# Patient Record
Sex: Male | Born: 1990 | Race: Black or African American | Hispanic: No | Marital: Single | Smoking: Never smoker
Health system: Southern US, Community
[De-identification: ages and names within clinical notes are randomized; demographics above are authoritative.]

## PROBLEM LIST (undated history)

## (undated) DIAGNOSIS — I82409 Acute embolism and thrombosis of unspecified deep veins of unspecified lower extremity: Secondary | ICD-10-CM

## (undated) DIAGNOSIS — F3181 Bipolar II disorder: Secondary | ICD-10-CM

## (undated) DIAGNOSIS — D6859 Other primary thrombophilia: Secondary | ICD-10-CM

---

## 2012-04-19 ENCOUNTER — Other Ambulatory Visit: Payer: Self-pay | Admitting: Pediatrics

## 2012-04-19 LAB — IRON: Iron: 211 ug/dL — ABNORMAL HIGH

## 2012-04-19 LAB — PROTIME-INR
INR: 1.5
Prothrombin Time: 18.8 secs — ABNORMAL HIGH (ref 11.5–14.7)

## 2012-04-30 ENCOUNTER — Other Ambulatory Visit: Payer: Self-pay | Admitting: Pediatrics

## 2012-05-03 DIAGNOSIS — Z7901 Long term (current) use of anticoagulants: Secondary | ICD-10-CM | POA: Insufficient documentation

## 2018-02-19 DIAGNOSIS — F329 Major depressive disorder, single episode, unspecified: Secondary | ICD-10-CM | POA: Diagnosis present

## 2018-02-19 DIAGNOSIS — F32A Depression, unspecified: Secondary | ICD-10-CM | POA: Diagnosis present

## 2019-09-27 ENCOUNTER — Other Ambulatory Visit: Payer: Self-pay

## 2019-09-27 ENCOUNTER — Emergency Department: Payer: Self-pay

## 2019-09-27 ENCOUNTER — Encounter: Payer: Self-pay | Admitting: Emergency Medicine

## 2019-09-27 ENCOUNTER — Emergency Department
Admission: EM | Admit: 2019-09-27 | Discharge: 2019-09-27 | Disposition: A | Discharge status: Home / Self Care | Payer: Self-pay | Attending: Student in an Organized Health Care Education/Training Program | Admitting: Student in an Organized Health Care Education/Training Program

## 2019-09-27 DIAGNOSIS — R0789 Other chest pain: Secondary | ICD-10-CM | POA: Insufficient documentation

## 2019-09-27 DIAGNOSIS — Z7901 Long term (current) use of anticoagulants: Secondary | ICD-10-CM | POA: Insufficient documentation

## 2019-09-27 DIAGNOSIS — R079 Chest pain, unspecified: Secondary | ICD-10-CM

## 2019-09-27 DIAGNOSIS — R791 Abnormal coagulation profile: Secondary | ICD-10-CM | POA: Insufficient documentation

## 2019-09-27 HISTORY — DX: Other primary thrombophilia: D68.59

## 2019-09-27 HISTORY — DX: Acute embolism and thrombosis of unspecified deep veins of unspecified lower extremity: I82.409

## 2019-09-27 LAB — BASIC METABOLIC PANEL
Anion gap: 10 (ref 5–15)
BUN: 20 mg/dL (ref 6–20)
CO2: 27 mmol/L (ref 22–32)
Calcium: 9.1 mg/dL (ref 8.9–10.3)
Chloride: 102 mmol/L (ref 98–111)
Creatinine, Ser: 1.11 mg/dL (ref 0.61–1.24)
GFR calc Af Amer: >60 mL/min (ref >60)
GFR calc non Af Amer: >60 mL/min (ref >60)
Glucose, Bld: 77 mg/dL (ref 70–99)
Potassium: 4 mmol/L (ref 3.5–5.1)
Sodium: 139 mmol/L (ref 135–145)

## 2019-09-27 LAB — CBC
HCT: 45.9 % (ref 39.0–52.0)
Hemoglobin: 15.6 g/dL (ref 13.0–17.0)
MCH: 30.8 pg (ref 26.0–34.0)
MCHC: 34 g/dL (ref 30.0–36.0)
MCV: 90.5 fL (ref 80.0–100.0)
Platelets: 218 10*3/uL (ref 150–400)
RBC: 5.07 MIL/uL (ref 4.22–5.81)
RDW: 12.9 % (ref 11.5–15.5)
WBC: 5.3 10*3/uL (ref 4.0–10.5)
nRBC: 0 % (ref 0.0–0.2)

## 2019-09-27 LAB — PROTIME-INR
INR: 1.1 (ref 0.8–1.2)
Prothrombin Time: 14.5 seconds (ref 11.4–15.2)

## 2019-09-27 LAB — TROPONIN I (HIGH SENSITIVITY)
Troponin I (High Sensitivity): 3 ng/L (ref <18)
Troponin I (High Sensitivity): 3 ng/L (ref <18)

## 2019-09-27 LAB — BRAIN NATRIURETIC PEPTIDE: B Natriuretic Peptide: 12 pg/mL (ref 0.0–100.0)

## 2019-09-27 MED ORDER — ENOXAPARIN SODIUM 80 MG/0.8ML ~~LOC~~ SOLN: 1.0000 mg/kg | Freq: Two times a day (BID) | SUBCUTANEOUS | 0 refills | Status: DC

## 2019-09-27 MED ORDER — ENOXAPARIN SODIUM 80 MG/0.8ML ~~LOC~~ SOLN
1.0000 mg/kg | Freq: Once | SUBCUTANEOUS | Status: AC
  Administered 2019-09-27: 75 mg via SUBCUTANEOUS
  Filled 2019-09-27 (×2): qty 0.8

## 2019-09-27 MED ORDER — TRAMADOL HCL 50 MG PO TABS: 50.0000 mg | ORAL_TABLET | Freq: Four times a day (QID) | ORAL | 0 refills | Status: DC | PRN

## 2019-09-27 NOTE — ED Notes (Signed)
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  Reports taking Coumadin as prescribed but has not had INR checked recently.

## 2019-09-27 NOTE — ED Provider Notes (Signed)
[facility] Emergency Department Provider Note    First MD Initiated Contact with Patient 09/27/19 1329     (approximate)  I have reviewed the triage vital signs and the nursing notes.   HISTORY  Chief Complaint Chest Pain    HPI Ledarrius L Morgan is a 28 y.o. male presents the ER for evaluation of right-sided chest pain and some shortness of breath developed over the past several days.  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  He is on Coumadin taking 10 mg daily for the past several months without issues.  He has not followed up in Coumadin clinic in over 1 month.  States he does eat lots of leafy green vegetables.  Denies any fatigue.  Pain is fairly mild.  Otherwise feels well.  No nausea or vomiting.  States he just wanted to be evaluated given his history.    Past Medical History:  Diagnosis Date  . DVT (deep venous thrombosis) (HCC)   . Protein C deficiency (HCC)   . Protein S deficiency (HCC)    No family history on file. History reviewed. No pertinent surgical history. There are no active problems to display for this patient.     Prior to Admission medications   Medication Sig Start Date End Date Taking? Authorizing Provider  enoxaparin (LOVENOX) 80 MG/0.8ML injection Inject 0.75 mLs (75 mg total) into the skin every 12 (twelve) hours for 5 days. 09/27/19 10/02/19  Willy Eddy, MD  traMADol (ULTRAM) 50 MG tablet Take 1 tablet (50 mg total) by mouth every 6 (six) hours as needed. 09/27/19 09/26/20  Willy Eddy, MD    Allergies Demerol [meperidine hcl], Dilaudid [hydromorphone hcl], and Percocet [oxycodone-acetaminophen]    Social History Social History   Tobacco Use  . Smoking status: Never Smoker  . Smokeless tobacco: Never Used  Substance Use Topics  . Alcohol use: Yes    Frequency: Never  . Drug use: Never    Review of Systems Patient denies  headaches, rhinorrhea, blurry vision, numbness, shortness of breath, chest pain, edema, cough, abdominal pain, nausea, vomiting, diarrhea, dysuria, fevers, rashes or hallucinations unless otherwise stated above in HPI. ____________________________________________   PHYSICAL EXAM:  VITAL SIGNS: Vitals:   09/27/19 1145 09/27/19 1359  BP: 118/81 123/82  Pulse: 69 68  Resp: 18 16  Temp: 98.2 F (36.8 C)   SpO2: 99% 99%    Constitutional: Alert and oriented.  Eyes: Conjunctivae are normal.  Head: Atraumatic. Nose: No congestion/rhinnorhea. Mouth/Throat: Mucous membranes are moist.   Neck: No stridor. Painless ROM.  Cardiovascular: Normal rate, regular rhythm. Grossly normal heart sounds.  Good peripheral circulation. Respiratory: Normal respiratory effort.  No retractions. Lungs CTAB. Gastrointestinal: Soft and nontender. No distention. No abdominal bruits. No CVA tenderness. Genitourinary:  Musculoskeletal: No lower extremity tenderness nor edema.  No joint effusions. Neurologic:  Normal speech and language. No gross focal neurologic deficits are appreciated. No facial droop Skin:  Skin is warm, dry and intact. No rash noted. Psychiatric: Mood and affect are normal. Speech and behavior are normal.  ____________________________________________   LABS (all labs ordered are listed, but only abnormal results are displayed)  Results for orders placed or performed during the hospital encounter of 09/27/19 (from the past 24 hour(s))  Basic metabolic panel     Status: None   Collection Time: 09/27/19 11:54 AM  Result Value Ref Range   Sodium 139 135 -  145 mmol/L   Potassium 4.0 3.5 - 5.1 mmol/L   Chloride 102 98 - 111 mmol/L   CO2 27 22 - 32 mmol/L   Glucose, Bld 77 70 - 99 mg/dL   BUN 20 6 - 20 mg/dL   Creatinine, Ser 1.11 0.61 - 1.24 mg/dL   Calcium 9.1 8.9 - 10.3 mg/dL   GFR calc non Af Amer >60 >60 mL/min   GFR calc Af Amer >60 >60 mL/min   Anion gap 10 5 - 15  CBC      Status: None   Collection Time: 09/27/19 11:54 AM  Result Value Ref Range   WBC 5.3 4.0 - 10.5 K/uL   RBC 5.07 4.22 - 5.81 MIL/uL   Hemoglobin 15.6 13.0 - 17.0 g/dL   HCT 45.9 39.0 - 52.0 %   MCV 90.5 80.0 - 100.0 fL   MCH 30.8 26.0 - 34.0 pg   MCHC 34.0 30.0 - 36.0 g/dL   RDW 12.9 11.5 - 15.5 %   Platelets 218 150 - 400 K/uL   nRBC 0.0 0.0 - 0.2 %  Troponin I (High Sensitivity)     Status: None   Collection Time: 09/27/19 11:54 AM  Result Value Ref Range   Troponin I (High Sensitivity) 3 <18 ng/L  Protime-INR (order if Patient is taking Coumadin / Warfarin)     Status: None   Collection Time: 09/27/19 11:54 AM  Result Value Ref Range   Prothrombin Time 14.5 11.4 - 15.2 seconds   INR 1.1 0.8 - 1.2  Brain natriuretic peptide     Status: None   Collection Time: 09/27/19 11:54 AM  Result Value Ref Range   B Natriuretic Peptide 12.0 0.0 - 100.0 pg/mL  Troponin I (High Sensitivity)     Status: None   Collection Time: 09/27/19  1:54 PM  Result Value Ref Range   Troponin I (High Sensitivity) 3 <18 ng/L   ____________________________________________  EKG My review and personal interpretation at Time: 11.42   Indication: chest pain  Rate: 60  Rhythm: sinus Axis: normal Other: no stemi or st depression ____________________________________________  RADIOLOGY  I personally reviewed all radiographic images ordered to evaluate for the above acute complaints and reviewed radiology reports and findings.  These findings were personally discussed with the patient.  Please see medical record for radiology report.  ____________________________________________   PROCEDURES  Procedure(s) performed:  Procedures    Critical Care performed: no ____________________________________________   INITIAL IMPRESSION / ASSESSMENT AND PLAN / ED COURSE  Pertinent labs & imaging results that were available during my care of the patient were reviewed by me and considered in my medical decision  making (see chart for details).   DDX: ACS, pericarditis, esophagitis, boerhaaves, pe, dissection, pna, bronchitis, costochondritis   Lance Morgan is a 28 y.o. who presents to the ED with symptoms as described above.  Patient's presentation is somewhat complicated by his history of protein C&S deficiency.  Is on Coumadin however his INR is low.  I have a lower suspicion for ACS.  May be PE but if so patient is minimally symptomatic without any tachycardia or hypoxia.  Will order cardiac enzymes as well as BNP to further restratify and evaluate for any evidence of right heart strain.  Clinical Course as of Sep 26 1499  Wed Sep 27, 2019  1458 Patient's blood work is reassuring.  At this point not showing any signs of right heart strain.  He is supposed to be anticoagulated therefore  do not feel that CT imaging clinically indicated at this time as does not change her management.  Discussed the importance of close outpatient follow-up with Coumadin clinic.  Patient agrees to keep better track of his diet as that may may be impacting his INR.  Will start Lovenox bridge in the interim until he can follow-up with PCP.   [PR]    Clinical Course User Index [PR] Willy Eddyobinson, Jaquila Santelli, MD    The patient was evaluated in Emergency Department today for the symptoms described in the history of present illness. He/she was evaluated in the context of the global COVID-19 pandemic, which necessitated consideration that the patient might be at risk for infection with the SARS-CoV-2 virus that causes COVID-19. Institutional protocols and algorithms that pertain to the evaluation of patients at risk for COVID-19 are in a state of rapid change based on information released by regulatory bodies including the CDC and federal and state organizations. These policies and algorithms were followed during the patient's care in the ED.  As part of my medical decision making, I reviewed the following data within the electronic  MEDICAL RECORD NUMBER Nursing notes reviewed and incorporated, Labs reviewed, notes from prior ED visits and [place] Controlled Substance Database   ____________________________________________   FINAL CLINICAL IMPRESSION(S) / ED DIAGNOSES  Final diagnoses:  Chest pain, unspecified type  Subtherapeutic international normalized ratio (INR)      NEW MEDICATIONS STARTED DURING THIS VISIT:  New Prescriptions   ENOXAPARIN (LOVENOX) 80 MG/0.8ML INJECTION    Inject 0.75 mLs (75 mg total) into the skin every 12 (twelve) hours for 5 days.   TRAMADOL (ULTRAM) 50 MG TABLET    Take 1 tablet (50 mg total) by mouth every 6 (six) hours as needed.     Note:  This document was prepared using Dragon voice recognition software and may include unintentional dictation errors.    Willy Eddyobinson, Arrabella Westerman, MD 09/27/19 1500

## 2019-09-27 NOTE — Discharge Instructions (Signed)
Please call to make an appointment with your PCP for further management of your Coumadin levels.  I have ordered a Lovenox bridge for the next 5 days sent to your pharmacy.

## 2019-09-27 NOTE — ED Triage Notes (Addendum)
Pt in via POV, reports right side chest pain over the last few days, worsening today.  Denies accompanying symptoms, denies any recent injury; NAD noted at this time.

## 2019-10-18 ENCOUNTER — Emergency Department
Admission: EM | Admit: 2019-10-18 | Discharge: 2019-10-18 | Disposition: A | Discharge status: Home / Self Care | Payer: Self-pay | Attending: Emergency Medicine | Admitting: Emergency Medicine

## 2019-10-18 ENCOUNTER — Other Ambulatory Visit: Payer: Self-pay

## 2019-10-18 ENCOUNTER — Emergency Department: Payer: Self-pay

## 2019-10-18 ENCOUNTER — Encounter: Payer: Self-pay | Admitting: Emergency Medicine

## 2019-10-18 DIAGNOSIS — I808 Phlebitis and thrombophlebitis of other sites: Secondary | ICD-10-CM | POA: Insufficient documentation

## 2019-10-18 DIAGNOSIS — M7989 Other specified soft tissue disorders: Secondary | ICD-10-CM

## 2019-10-18 DIAGNOSIS — I809 Phlebitis and thrombophlebitis of unspecified site: Secondary | ICD-10-CM

## 2019-10-18 DIAGNOSIS — Z7901 Long term (current) use of anticoagulants: Secondary | ICD-10-CM | POA: Insufficient documentation

## 2019-10-18 DIAGNOSIS — M79602 Pain in left arm: Secondary | ICD-10-CM

## 2019-10-18 LAB — CBC
HCT: 47.3 % (ref 39.0–52.0)
Hemoglobin: 16.4 g/dL (ref 13.0–17.0)
MCH: 30.5 pg (ref 26.0–34.0)
MCHC: 34.7 g/dL (ref 30.0–36.0)
MCV: 88.1 fL (ref 80.0–100.0)
Platelets: 185 10*3/uL (ref 150–400)
RBC: 5.37 MIL/uL (ref 4.22–5.81)
RDW: 12.8 % (ref 11.5–15.5)
WBC: 4.2 10*3/uL (ref 4.0–10.5)
nRBC: 0 % (ref 0.0–0.2)

## 2019-10-18 LAB — PROTIME-INR
INR: 1.6 — ABNORMAL HIGH (ref 0.8–1.2)
Prothrombin Time: 19.3 seconds — ABNORMAL HIGH (ref 11.4–15.2)

## 2019-10-18 LAB — COMPREHENSIVE METABOLIC PANEL
ALT: 16 U/L (ref 0–44)
AST: 18 U/L (ref 15–41)
Albumin: 4.5 g/dL (ref 3.5–5.0)
Alkaline Phosphatase: 48 U/L (ref 38–126)
Anion gap: 14 (ref 5–15)
BUN: 13 mg/dL (ref 6–20)
CO2: 25 mmol/L (ref 22–32)
Calcium: 8.7 mg/dL — ABNORMAL LOW (ref 8.9–10.3)
Chloride: 98 mmol/L (ref 98–111)
Creatinine, Ser: 1.12 mg/dL (ref 0.61–1.24)
GFR calc Af Amer: >60 mL/min (ref >60)
GFR calc non Af Amer: >60 mL/min (ref >60)
Glucose, Bld: 92 mg/dL (ref 70–99)
Potassium: 4.3 mmol/L (ref 3.5–5.1)
Sodium: 137 mmol/L (ref 135–145)
Total Bilirubin: 0.9 mg/dL (ref 0.3–1.2)
Total Protein: 7.6 g/dL (ref 6.5–8.1)

## 2019-10-18 LAB — APTT: aPTT: 43 seconds — ABNORMAL HIGH (ref 24–36)

## 2019-10-18 NOTE — ED Notes (Signed)
Pt in US

## 2019-10-18 NOTE — Discharge Instructions (Addendum)
Warm compresses and elevation. No DVT found.    1. No evidence of DVT within the left upper extremity. 2. Examination is positive for occlusive superficial thrombophlebitis involving the left basilic vein. Electronically Signed   By: Sandi Mariscal M.D.   On: 10/18/2019 12:49   Per hematology recommends:  10mg  MWFSS days a week.  7.5 2 days a week T/TH INR f/u in one week.

## 2019-10-18 NOTE — ED Triage Notes (Addendum)
Pt sent from [facility] for rule out DVT in LFT forearm. PT hx of DVT, takes coumadin. Swelling noted. C/o pain and swelling to extremity x1wk.   AND ntoed

## 2019-10-18 NOTE — ED Notes (Signed)
Pt reports for the last week and a half his left forearm has had some swelling and pain along the vein. Pt denies injuries. Redness and swelling noted.

## 2019-10-18 NOTE — ED Provider Notes (Signed)
[facility] Emergency Department Provider Note  ____________________________________________   First MD Initiated Contact with Patient 10/18/19 1221     (approximate)  I have reviewed the triage vital signs and the nursing notes.   HISTORY  Chief Complaint Arm Pain    HPI Carlton L Kingry is a 28 y.o. male with prior history of DVT secondary to protein C and S deficiency in the left forearm who is on Coumadin who presents with worsening pain and swelling to his extremity for the past 1 week.  Swelling is constant, moderate, nothing makes better, nothing makes it worse.  To note patient was seen on 10/28 with subtherapeutic INR and was bridged with Lovenox.  He was placed on 5 days of Lovenox but he did not take it.  He says he was not able to afford it.  He says that he instead was just more consistent with taking his warfarin since prior to being seen he had some days where he would actually forget to take it.  He took 10 mg today.  He takes 10 mg Monday Wednesday Friday and 7.5 the other days.  He denies any shortness of breath or chest pain.          Past Medical History:  Diagnosis Date   DVT (deep venous thrombosis) (HCC)    Protein C deficiency (HCC)    Protein S deficiency (HCC)     There are no active problems to display for this patient.   History reviewed. No pertinent surgical history.  Prior to Admission medications   Medication Sig Start Date End Date Taking? Authorizing Provider  enoxaparin (LOVENOX) 80 MG/0.8ML injection Inject 0.75 mLs (75 mg total) into the skin every 12 (twelve) hours for 5 days. 09/27/19 10/02/19  Willy Eddy, MD  traMADol (ULTRAM) 50 MG tablet Take 1 tablet (50 mg total) by mouth every 6 (six) hours as needed. 09/27/19 09/26/20  Willy Eddy, MD    Allergies Demerol [meperidine hcl], Dilaudid [hydromorphone hcl], and Percocet [oxycodone-acetaminophen]  No family history on file.  Social  History Social History   Tobacco Use   Smoking status: Never Smoker   Smokeless tobacco: Never Used  Substance Use Topics   Alcohol use: Yes    Frequency: Never   Drug use: Never      Review of Systems Constitutional: No fever/chills Eyes: No visual changes. ENT: No sore throat. Cardiovascular: Denies chest pain. Respiratory: Denies shortness of breath. Gastrointestinal: No abdominal pain.  No nausea, no vomiting.  No diarrhea.  No constipation. Genitourinary: Negative for dysuria. Musculoskeletal: Negative for back pain. Skin: Negative for rash. Neurological: Negative for headaches, focal weakness or numbness. All other ROS negative ____________________________________________   PHYSICAL EXAM:  VITAL SIGNS: ED Triage Vitals  Enc Vitals Group     BP 10/18/19 1142 118/76     Pulse Rate 10/18/19 1142 78     Resp 10/18/19 1142 16     Temp 10/18/19 1143 99.2 F (37.3 C)     Temp Source 10/18/19 1143 Oral     SpO2 10/18/19 1142 97 %     Weight --      Height --      Head Circumference --      Peak Flow --      Pain Score 10/18/19 1141 7     Pain Loc --      Pain Edu? --      Excl. in GC? --     Constitutional: Alert  and oriented. Well appearing and in no acute distress. Eyes: Conjunctivae are normal. EOMI. Head: Atraumatic. Nose: No congestion/rhinnorhea. Mouth/Throat: Mucous membranes are moist.   Neck: No stridor. Trachea Midline. FROM Cardiovascular: Normal rate, regular rhythm. Grossly normal heart sounds.  Good peripheral circulation. Respiratory: Normal respiratory effort.  No retractions. Lungs CTAB. Gastrointestinal: Soft and nontender. No distention. No abdominal bruits.  Musculoskeletal: Palpable cord on the left arm with some mild erythema, 2+ distal pulse, mild swelling compared to the arm. Neurologic:  Normal speech and language. No gross focal neurologic deficits are appreciated.  Skin:  Skin is warm, dry and intact. No rash  noted. Psychiatric: Mood and affect are normal. Speech and behavior are normal. GU: Deferred   ____________________________________________   LABS (all labs ordered are listed, but only abnormal results are displayed)  Labs Reviewed  COMPREHENSIVE METABOLIC PANEL - Abnormal; Notable for the following components:      Result Value   Calcium 8.7 (*)    All other components within normal limits  APTT - Abnormal; Notable for the following components:   aPTT 43 (*)    All other components within normal limits  PROTIME-INR - Abnormal; Notable for the following components:   Prothrombin Time 19.3 (*)    INR 1.6 (*)    All other components within normal limits  CBC   ____________________________________________  RADIOLOGY   Official radiology report(s): [place] Venous Img Upper Uni Left (dvt)  Result Date: 10/18/2019 CLINICAL DATA:  Left lower extremity pain and edema. History of DVT. Patient is currently on anticoagulation. Evaluate for acute or chronic DVT. EXAM: LEFT UPPER EXTREMITY VENOUS DOPPLER ULTRASOUND TECHNIQUE: Gray-scale sonography with graded compression, as well as color Doppler and duplex ultrasound were performed to evaluate the upper extremity deep venous system from the level of the subclavian vein and including the jugular, axillary, basilic, radial, ulnar and upper cephalic vein. Spectral Doppler was utilized to evaluate flow at rest and with distal augmentation maneuvers. COMPARISON:  None. FINDINGS: Contralateral Subclavian Vein: Respiratory phasicity is normal and symmetric with the symptomatic side. No evidence of thrombus. Normal compressibility. Internal Jugular Vein: No evidence of thrombus. Normal compressibility, respiratory phasicity and response to augmentation. Subclavian Vein: No evidence of thrombus. Normal compressibility, respiratory phasicity and response to augmentation. Axillary Vein: No evidence of thrombus. Normal compressibility, respiratory phasicity and  response to augmentation. Cephalic Vein: No evidence of thrombus. Normal compressibility, respiratory phasicity and response to augmentation. Basilic Vein: There is hypoechoic occlusive thrombus involving the left basilic vein (images 20 through 24). Brachial Veins: No evidence of thrombus. Normal compressibility, respiratory phasicity and response to augmentation. Radial Veins: No evidence of thrombus. Normal compressibility, respiratory phasicity and response to augmentation. Ulnar Veins: No evidence of thrombus. Normal compressibility, respiratory phasicity and response to augmentation. Venous Reflux:  None visualized. Other Findings:  None visualized. IMPRESSION: 1. No evidence of DVT within the left upper extremity. 2. Examination is positive for occlusive superficial thrombophlebitis involving the left basilic vein. Electronically Signed   By: Simonne ComeJohn  Watts M.D.   On: 10/18/2019 12:49    ____________________________________________   PROCEDURES  Procedure(s) performed (including Critical Care):  Procedures   ____________________________________________   INITIAL IMPRESSION / ASSESSMENT AND PLAN / ED COURSE  Pate L Mapps was evaluated in Emergency Department on 10/18/2019 for the symptoms described in the history of present illness. He was evaluated in the context of the global COVID-19 pandemic, which necessitated consideration that the patient might be at risk for infection with the  SARS-CoV-2 virus that causes COVID-19. Institutional protocols and algorithms that pertain to the evaluation of patients at risk for COVID-19 are in a state of rapid change based on information released by regulatory bodies including the CDC and federal and state organizations. These policies and algorithms were followed during the patient's care in the ED.    This is most concerning for superficial thrombophlebitis versus DVT.  No fluctuation to suggest abscess.  If no exam is just cellulitis.  Good distal  pulse so unlikely arterial occlusion.  No shortness of breath or chest pain to suggest PE.  Will evaluate patient's INR to see if he is therapeutic.   INR is 1.6 which is subtherapeutic but higher than it was 3 weeks ago at 1.1.  Ultrasound does not have evidence of DVT but does have occlusive superficial thrombophlebitis in the involving the left basilic vein.   D/w Dr. Mike Gip from hematology given pt has now had multiple ED visits with subtherapeutic INR.  Total 60mg  warfarin daily.  10mg  5 days a week.  7.5 2 days a week. INR f/u in one week. Does not need lovenox bridge at this time giving INR.   I discussed the provisional nature of ED diagnosis, the treatment so far, the ongoing plan of care, follow up appointments and return precautions with the patient and any family or support people present. They expressed understanding and agreed with the plan, discharged home.       ____________________________________________   FINAL CLINICAL IMPRESSION(S) / ED DIAGNOSES   Final diagnoses:  Arm swelling  Thrombophlebitis      MEDICATIONS GIVEN DURING THIS VISIT:  Medications - No data to display   ED Discharge Orders    None       Note:  This document was prepared using Dragon voice recognition software and may include unintentional dictation errors.   Vanessa [place], MD 10/18/19 [phone]

## 2019-12-07 ENCOUNTER — Other Ambulatory Visit: Payer: Self-pay

## 2019-12-08 ENCOUNTER — Ambulatory Visit: Payer: PRIVATE HEALTH INSURANCE | Attending: Internal Medicine

## 2019-12-08 DIAGNOSIS — Z20822 Contact with and (suspected) exposure to covid-19: Secondary | ICD-10-CM

## 2019-12-10 LAB — NOVEL CORONAVIRUS, NAA: SARS-CoV-2, NAA: NOT DETECTED

## 2020-01-15 ENCOUNTER — Ambulatory Visit: Payer: PRIVATE HEALTH INSURANCE | Attending: Internal Medicine

## 2020-07-31 ENCOUNTER — Ambulatory Visit
Admission: EM | Admit: 2020-07-31 | Discharge: 2020-07-31 | Disposition: A | Discharge status: Home / Self Care | Payer: PRIVATE HEALTH INSURANCE | Attending: Emergency Medicine | Admitting: Emergency Medicine

## 2020-07-31 ENCOUNTER — Other Ambulatory Visit: Payer: Self-pay

## 2020-07-31 ENCOUNTER — Encounter: Payer: Self-pay | Admitting: Emergency Medicine

## 2020-07-31 DIAGNOSIS — R609 Edema, unspecified: Secondary | ICD-10-CM | POA: Diagnosis not present

## 2020-07-31 HISTORY — DX: Bipolar II disorder: F31.81

## 2020-07-31 LAB — URINALYSIS, COMPLETE (UACMP) WITH MICROSCOPIC
Bilirubin Urine: NEGATIVE
Glucose, UA: NEGATIVE mg/dL
Hgb urine dipstick: NEGATIVE
Leukocytes,Ua: NEGATIVE
Nitrite: NEGATIVE
Protein, ur: NEGATIVE mg/dL
RBC / HPF: NONE SEEN RBC/hpf (ref 0–5)
Specific Gravity, Urine: >1.03 — ABNORMAL HIGH (ref 1.005–1.030)
pH: 6 (ref 5.0–8.0)

## 2020-07-31 LAB — BASIC METABOLIC PANEL
Anion gap: 8 (ref 5–15)
BUN: 16 mg/dL (ref 6–20)
CO2: 29 mmol/L (ref 22–32)
Calcium: 9.2 mg/dL (ref 8.9–10.3)
Chloride: 103 mmol/L (ref 98–111)
Creatinine, Ser: 1.1 mg/dL (ref 0.61–1.24)
GFR calc Af Amer: >60 mL/min (ref >60)
GFR calc non Af Amer: >60 mL/min (ref >60)
Glucose, Bld: 140 mg/dL — ABNORMAL HIGH (ref 70–99)
Potassium: 3.5 mmol/L (ref 3.5–5.1)
Sodium: 140 mmol/L (ref 135–145)

## 2020-07-31 NOTE — ED Provider Notes (Signed)
HPI  SUBJECTIVE:  Lance Morgan is a 29 y.o. male who presents with 2 months of daily bilateral ankle swelling..  States that symptoms started after spraining his left ankle in early July.  States that he sprained his right ankle several weeks later.  States that the swelling did not go down after the injury.  He states that he did not have bilateral ankle swelling until he sprained his right ankle.  He tried elevation, ice, compression stockings, Ace wrap, Aircast.  Elevation helps.  States that his edema is down in the morning, and worse at the end of the day after he has been standing on his feet.  He is a Runner, broadcasting/film/video.  He denies ankle pain, but reports tightness.  He also reports bilateral calf swelling to his knee starting yesterday.   right calf seems a little bit larger than the left.  He reports pain/pressure and soreness in his heels.  No ankle pain, foot pain.  No calf pain, shortness of breath, hemoptysis, recent immobilization, surgery in the past 4 weeks.  No unintentional weight gain, nocturia, PND, orthopnea, dyspnea on exertion, abdominal pain, coughing, wheezing.  He denies swelling elsewhere.  Normal urine.  No hematuria.  No urinary complaints.  states that he does not generally add salt to his diet but eats at restaurants frequently.  He has never had symptoms like this before.  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  States that he is compliant with his Eliquis.  He states that the DVT was painful.  He has no leg pain.  No history of chronic kidney disease, diabetes, hypertension, lupus, glomerulonephritis, CHF.  OXB:DZHGDJ, [facility]   Past Medical History:  Diagnosis Date  . Bipolar 2 disorder (HCC)   . DVT (deep venous thrombosis) (HCC)   . Protein C deficiency (HCC)   . Protein S deficiency (HCC)     History reviewed. No pertinent surgical history.  Family History  Problem Relation Age of  Onset  . Healthy Mother   . Healthy Father     Social History   Tobacco Use  . Smoking status: Never Smoker  . Smokeless tobacco: Never Used  Vaping Use  . Vaping Use: Never used  Substance Use Topics  . Alcohol use: Yes  . Drug use: Never    No current facility-administered medications for this encounter.  Current Outpatient Medications:  .  apixaban (ELIQUIS) 5 MG TABS tablet, Take 5 mg by mouth 2 (two) times daily., Disp: , Rfl:  .  ARIPiprazole (ABILIFY) 20 MG tablet, Take 20 mg by mouth daily., Disp: , Rfl:  .  enoxaparin (LOVENOX) 80 MG/0.8ML injection, Inject 0.75 mLs (75 mg total) into the skin every 12 (twelve) hours for 5 days., Disp: 7.5 mL, Rfl: 0  Allergies  Allergen Reactions  . Demerol [Meperidine Hcl] Hives  . Dilaudid [Hydromorphone Hcl] Hives  . Percocet [Oxycodone-Acetaminophen] Hives     ROS  As noted in HPI.   Physical Exam  BP 125/81 (BP Location: Right Arm)   Pulse 72   Temp 98 F (36.7 C) (Oral)   Resp 18   Ht 5\' 10"  (1.778 m)   Wt 74.8 kg   SpO2 99%   BMI 23.68 kg/m   Constitutional: Well developed, well nourished, no acute distress Eyes:  EOMI, conjunctiva normal bilaterally HENT: Normocephalic, atraumatic,mucus membranes moist Respiratory: Normal inspiratory effort, lungs clear bilaterally Cardiovascular: Normal rate GI:  nondistended skin: No rash, skin intact Musculoskeletal: 1+ edema bilateral lower extremities extending into the ankles.  Right calf measures 40 cm, left calf measures 37 cm.  No calf tenderness, palpable cord bilaterally.  No tenderness along the medial thighs bilaterally.  No ligamentous or bony tenderness over bilateral ankles, feet.  Mild tenderness at the base of the heels bilaterally.  Normal appearance bilaterally.  No tenderness over the Achilles tendon.  No pain with full range of ankle motion.  Feet warm, pink.  DP 2+. Neurologic: Alert & oriented x 3, no focal neuro deficits Psychiatric: Speech and  behavior appropriate   ED Course   Medications - No data to display  Orders Placed This Encounter  Procedures  . Basic metabolic panel    Standing Status:   Standing    Number of Occurrences:   1  . Urinalysis, Complete w Microscopic    Standing Status:   Standing    Number of Occurrences:   1    Results for orders placed or performed during the hospital encounter of 07/31/20 (from the past 24 hour(s))  Basic metabolic panel     Status: Abnormal   Collection Time: 07/31/20  7:55 PM  Result Value Ref Range   Sodium 140 135 - 145 mmol/L   Potassium 3.5 3.5 - 5.1 mmol/L   Chloride 103 98 - 111 mmol/L   CO2 29 22 - 32 mmol/L   Glucose, Bld 140 (H) 70 - 99 mg/dL   BUN 16 6 - 20 mg/dL   Creatinine, Ser 6.62 0.61 - 1.24 mg/dL   Calcium 9.2 8.9 - 94.7 mg/dL   GFR calc non Af Amer >60 >60 mL/min   GFR calc Af Amer >60 >60 mL/min   Anion gap 8 5 - 15  Urinalysis, Complete w Microscopic Urine, Clean Catch     Status: Abnormal   Collection Time: 07/31/20  7:55 PM  Result Value Ref Range   Color, Urine YELLOW YELLOW   APPearance CLEAR CLEAR   Specific Gravity, Urine >1.030 (H) 1.005 - 1.030   pH 6.0 5.0 - 8.0   Glucose, UA NEGATIVE NEGATIVE mg/dL   Hgb urine dipstick NEGATIVE NEGATIVE   Bilirubin Urine NEGATIVE NEGATIVE   Ketones, ur TRACE (A) NEGATIVE mg/dL   Protein, ur NEGATIVE NEGATIVE mg/dL   Nitrite NEGATIVE NEGATIVE   Leukocytes,Ua NEGATIVE NEGATIVE   Squamous Epithelial / LPF 0-5 0 - 5   WBC, UA 0-5 0 - 5 WBC/hpf   RBC / HPF NONE SEEN 0 - 5 RBC/hpf   Bacteria, UA FEW (A) NONE SEEN   Mucus PRESENT    No results found.  ED Clinical Impression  1. Peripheral edema      ED Assessment/Plan  Patient with bilateral lower extremity edema.  Ankles are nontender, would not expect an ankle sprain to beasymptomatic and yet cause calf swelling.  Right calf is larger than the left, however decided not to ultrasound because even if it was positive for DVT he is currently on  the correct treatment.  He states that he is compliant with his Eliquis.  will check a BMP and a urine looking for kidney damage.  Blood pressure is normal.  Kidney function normal, no proteinuria.  He has a few bacteria in his urine but he has no urinary complaints.  Will not treat.  Will send home with compression stockings, advised no salt, eat at home, follow-up with Dr. Greggory Stallion.  To the ER for any signs of PE.  Discussed labs, MDM, treatment plan, and plan for follow-up with patient. Discussed sn/sx that should prompt return to the ED. patient agrees with plan.   No orders of the defined types were placed in this encounter.   *This clinic note was created using Dragon dictation software. Therefore, there may be occasional mistakes despite careful proofreading.   ?    Domenick Gong, MD 07/31/20 2037

## 2020-07-31 NOTE — Discharge Instructions (Addendum)
Your kidney function was normal.  There is no protein in your urine.  Do not add any salt to your diet, I will recommend compression stockings daily.  Elevate your legs as much as you can, follow-up with Dr. Greggory Stallion.  Go immediately to the emergency department for chest pain, shortness of breath, coughing up blood, leg pain, or for any other concerns.

## 2020-07-31 NOTE — ED Triage Notes (Signed)
Patient c/o bilateral ankle swelling that started 2 months ago. He reports he twisted his left ankle about 2 months ago, had swelling, but the swelling has not resolved. He states he was then playing golf after that and injured his right ankle that caused swelling that has not resolved.

## 2020-09-30 ENCOUNTER — Observation Stay
Admission: EM | Admit: 2020-09-30 | Discharge: 2020-10-01 | Disposition: A | Discharge status: Home / Self Care | Payer: PRIVATE HEALTH INSURANCE | Attending: Internal Medicine | Admitting: Internal Medicine

## 2020-09-30 ENCOUNTER — Other Ambulatory Visit: Payer: Self-pay

## 2020-09-30 ENCOUNTER — Encounter: Payer: Self-pay | Admitting: Internal Medicine

## 2020-09-30 ENCOUNTER — Emergency Department: Payer: PRIVATE HEALTH INSURANCE

## 2020-09-30 DIAGNOSIS — F329 Major depressive disorder, single episode, unspecified: Secondary | ICD-10-CM | POA: Diagnosis present

## 2020-09-30 DIAGNOSIS — Z95828 Presence of other vascular implants and grafts: Secondary | ICD-10-CM | POA: Insufficient documentation

## 2020-09-30 DIAGNOSIS — Z7901 Long term (current) use of anticoagulants: Secondary | ICD-10-CM | POA: Diagnosis not present

## 2020-09-30 DIAGNOSIS — R2241 Localized swelling, mass and lump, right lower limb: Secondary | ICD-10-CM | POA: Diagnosis present

## 2020-09-30 DIAGNOSIS — I82411 Acute embolism and thrombosis of right femoral vein: Secondary | ICD-10-CM | POA: Diagnosis not present

## 2020-09-30 DIAGNOSIS — Z86711 Personal history of pulmonary embolism: Secondary | ICD-10-CM | POA: Diagnosis not present

## 2020-09-30 DIAGNOSIS — D6851 Activated protein C resistance: Secondary | ICD-10-CM | POA: Insufficient documentation

## 2020-09-30 DIAGNOSIS — Z885 Allergy status to narcotic agent status: Secondary | ICD-10-CM | POA: Insufficient documentation

## 2020-09-30 DIAGNOSIS — I82401 Acute embolism and thrombosis of unspecified deep veins of right lower extremity: Secondary | ICD-10-CM | POA: Diagnosis not present

## 2020-09-30 DIAGNOSIS — M7989 Other specified soft tissue disorders: Secondary | ICD-10-CM

## 2020-09-30 DIAGNOSIS — F32A Depression, unspecified: Secondary | ICD-10-CM | POA: Diagnosis present

## 2020-09-30 DIAGNOSIS — D6859 Other primary thrombophilia: Secondary | ICD-10-CM | POA: Diagnosis present

## 2020-09-30 DIAGNOSIS — I87009 Postthrombotic syndrome without complications of unspecified extremity: Secondary | ICD-10-CM

## 2020-09-30 DIAGNOSIS — Z20822 Contact with and (suspected) exposure to covid-19: Secondary | ICD-10-CM | POA: Diagnosis not present

## 2020-09-30 LAB — BASIC METABOLIC PANEL
Anion gap: 8 (ref 5–15)
BUN: 15 mg/dL (ref 6–20)
CO2: 27 mmol/L (ref 22–32)
Calcium: 9 mg/dL (ref 8.9–10.3)
Chloride: 102 mmol/L (ref 98–111)
Creatinine, Ser: 1.13 mg/dL (ref 0.61–1.24)
GFR, Estimated: >60 mL/min (ref >60)
Glucose, Bld: 85 mg/dL (ref 70–99)
Potassium: 4.3 mmol/L (ref 3.5–5.1)
Sodium: 137 mmol/L (ref 135–145)

## 2020-09-30 LAB — APTT: aPTT: 37 seconds — ABNORMAL HIGH (ref 24–36)

## 2020-09-30 LAB — CBC
HCT: 47.9 % (ref 39.0–52.0)
Hemoglobin: 16.2 g/dL (ref 13.0–17.0)
MCH: 30.9 pg (ref 26.0–34.0)
MCHC: 33.8 g/dL (ref 30.0–36.0)
MCV: 91.4 fL (ref 80.0–100.0)
Platelets: 290 10*3/uL (ref 150–400)
RBC: 5.24 MIL/uL (ref 4.22–5.81)
RDW: 12.3 % (ref 11.5–15.5)
WBC: 7.3 10*3/uL (ref 4.0–10.5)
nRBC: 0 % (ref 0.0–0.2)

## 2020-09-30 LAB — RESPIRATORY PANEL BY RT PCR (FLU A&B, COVID)
Influenza A by PCR: NEGATIVE
Influenza B by PCR: NEGATIVE
SARS Coronavirus 2 by RT PCR: NEGATIVE

## 2020-09-30 LAB — MRSA PCR SCREENING: MRSA by PCR: NEGATIVE

## 2020-09-30 LAB — FIBRIN DERIVATIVES D-DIMER (ARMC ONLY): Fibrin derivatives D-dimer (ARMC): 1143.1 ng/mL (FEU) — ABNORMAL HIGH (ref 0.00–499.00)

## 2020-09-30 LAB — PROTIME-INR
INR: 1.2 (ref 0.8–1.2)
Prothrombin Time: 15.1 seconds (ref 11.4–15.2)

## 2020-09-30 MED ORDER — APIXABAN 5 MG PO TABS: 5.0000 mg | ORAL_TABLET | Freq: Two times a day (BID) | ORAL | Status: DC

## 2020-09-30 MED ORDER — APIXABAN 5 MG PO TABS
10.0000 mg | ORAL_TABLET | Freq: Once | ORAL | Status: AC
  Administered 2020-09-30: 10 mg via ORAL
  Filled 2020-09-30: qty 2

## 2020-09-30 MED ORDER — APIXABAN 5 MG PO TABS
10.0000 mg | ORAL_TABLET | Freq: Two times a day (BID) | ORAL | Status: DC
  Administered 2020-10-01: 10 mg via ORAL
  Filled 2020-09-30 (×2): qty 2

## 2020-09-30 NOTE — ED Provider Notes (Signed)
[facility] Emergency Department Provider Note  ____________________________________________  Time seen: Approximately 7:45 PM  I have reviewed the triage vital signs and the nursing notes.   HISTORY  Chief Complaint Leg Swelling    HPI Lance Morgan is a 29 y.o. male with PMH DVT, protein S deficiency and protein C deficiency that presents to the emergency department for evaluation of acute on chronic right leg swelling for 1 week.  Patient states that he had a stent placed in his right leg in 2013 after having a DVT.  He was put on Xarelto and then changed to warfarin and then changed to Eliquis a couple of months ago.  He has had increased swelling to the right leg since July after an ankle injury injury.  He went to [place] last week and swelling to the lower leg worsened.  He has been taking the Eliquis once per day.   Past Medical History:  Diagnosis Date   Bipolar 2 disorder (HCC)    DVT (deep venous thrombosis) (HCC)    Protein C deficiency (HCC)    Protein S deficiency [facility])     Patient Active Problem List   Diagnosis Date Noted   Right femoral vein DVT (HCC) 09/30/2020    No past surgical history on file.  Prior to Admission medications   Medication Sig Start Date End Date Taking? Authorizing Provider  apixaban (ELIQUIS) 5 MG TABS tablet Take 5 mg by mouth 2 (two) times daily.    [provider]  ARIPiprazole (ABILIFY) 20 MG tablet Take 20 mg by mouth daily.    [provider]  enoxaparin (LOVENOX) 80 MG/0.8ML injection Inject 0.75 mLs (75 mg total) into the skin every 12 (twelve) hours for 5 days. 09/27/19 10/02/19  Willy Eddy, MD    Allergies Demerol [meperidine hcl], Dilaudid [hydromorphone hcl], and Percocet [oxycodone-acetaminophen]  Family History  Problem Relation Age of Onset   Healthy Mother    Healthy Father     Social History Social History   Tobacco Use   Smoking status: Never Smoker    Smokeless tobacco: Never Used  Building services engineer Use: Never used  Substance Use Topics   Alcohol use: Yes   Drug use: Never     Review of Systems  Cardiovascular: No chest pain. Respiratory: No SOB. Gastrointestinal: No abdominal pain.  No nausea, no vomiting.  Musculoskeletal: Negative for musculoskeletal pain. Positive for leg swelling. Skin: Negative for rash, abrasions, lacerations, ecchymosis. Neurological: Negative for numbness or tingling   ____________________________________________   PHYSICAL EXAM:  VITAL SIGNS: ED Triage Vitals  Enc Vitals Group     BP 09/30/20 1634 136/73     Pulse Rate 09/30/20 1634 65     Resp 09/30/20 1634 16     Temp 09/30/20 1634 98 F (36.7 C)     Temp Source 09/30/20 1634 Oral     SpO2 09/30/20 1634 98 %     Weight 09/30/20 1635 160 lb (72.6 kg)     Height 09/30/20 1635 5\' 10"  (1.778 m)     Head Circumference --      Peak Flow --      Pain Score 09/30/20 1635 4     Pain Loc --      Pain Edu? --      Excl. in GC? --      Constitutional: Alert and oriented. Well appearing and in no acute distress. Eyes: Conjunctivae are normal. PERRL. EOMI. Head: Atraumatic. ENT:  Ears:      Nose: No congestion/rhinnorhea.      Mouth/Throat: Mucous membranes are moist.  Neck: No stridor.   Cardiovascular: Normal rate, regular rhythm.  Good peripheral circulation. Respiratory: Normal respiratory effort without tachypnea or retractions. Lungs CTAB. Good air entry to the bases with no decreased or absent breath sounds. Gastrointestinal: Bowel sounds 4 quadrants. Soft and nontender to palpation. No guarding or rigidity. No palpable masses. No distention.  Musculoskeletal: Full range of motion to all extremities. No gross deformities appreciated.  Moderate swelling to right lower extremity.  1+ pitting edema. Neurologic:  Normal speech and language. No gross focal neurologic deficits are appreciated.  Skin:  Skin is warm, dry and  intact. No rash noted. Psychiatric: Mood and affect are normal. Speech and behavior are normal. Patient exhibits appropriate insight and judgement.   ____________________________________________   LABS (all labs ordered are listed, but only abnormal results are displayed)  Labs Reviewed  FIBRIN DERIVATIVES D-DIMER ([facility] ONLY) - Abnormal; Notable for the following components:      Result Value   Fibrin derivatives D-dimer ([facility]) 1,143.10 (*)    All other components within normal limits  RESPIRATORY PANEL BY RT PCR (FLU A&B, COVID)  BASIC METABOLIC PANEL  CBC  APTT  PROTIME-INR   ____________________________________________  EKG   ____________________________________________  RADIOLOGY Lexine Baton, personally viewed and evaluated these images (plain radiographs) as part of my medical decision making, as well as reviewing the written report by the radiologist.  US Venous Img Lower Unilateral Right (DVT)  Result Date: 09/30/2020 CLINICAL DATA:  Leg swelling for 1 week. EXAM: RIGHT LOWER EXTREMITY VENOUS DOPPLER ULTRASOUND TECHNIQUE: Gray-scale sonography with compression, as well as color and duplex ultrasound, were performed to evaluate the deep venous system(s) from the level of the common femoral vein through the popliteal and proximal calf veins. COMPARISON:  None. FINDINGS: VENOUS Stent noted within the right femoral vein. There is occlusive thrombus in the right common femoral vein and the right femoral vein. Nonocclusive thrombus within the popliteal vein. No evidence of DVT within the right posterior tibial vein and peroneal veins. Limited views of the contralateral common femoral vein are unremarkable. IMPRESSION: Deep vein thrombosis involving the right common femoral vein, femoral vein, and popliteal vein. Findings discussed with Dr. Scotty Court via telephone at 5:18 p.m. Electronically Signed   By: Feliberto Harts MD   On: 09/30/2020 17:21     ____________________________________________    PROCEDURES  Procedure(s) performed:    Procedures    Medications  apixaban (ELIQUIS) tablet 10 mg (10 mg Oral Given 09/30/20 1942)     ____________________________________________   INITIAL IMPRESSION / ASSESSMENT AND PLAN / ED COURSE  Pertinent labs & imaging results that were available during my care of the patient were reviewed by me and considered in my medical decision making (see chart for details).  Review of the [place] CSRS was performed in accordance of the [facility] prior to dispensing any controlled drugs.   Patient's diagnosis is consistent with DVT.  Vital signs and exam are reassuring.  Ultrasound shows a DVT involving the right common femoral vein, femoral vein and popliteal vein.  Patient has only been taking his Eliquis once per day, when the prescribed dosage was twice per day.  Dr. Wyn Quaker was consulted and will plan to do a thrombectomy tomorrow.  Dr. Para March was consulted for admission and is in agreement.  Quantarius L Tinnell was evaluated in Emergency Department on 09/30/2020 for the symptoms described  in the history of present illness. He was evaluated in the context of the global COVID-19 pandemic, which necessitated consideration that the patient might be at risk for infection with the SARS-CoV-2 virus that causes COVID-19. Institutional protocols and algorithms that pertain to the evaluation of patients at risk for COVID-19 are in a state of rapid change based on information released by regulatory bodies including the CDC and federal and state organizations. These policies and algorithms were followed during the patient's care in the ED.  ____________________________________________  FINAL CLINICAL IMPRESSION(S) / ED DIAGNOSES  Final diagnoses:  Leg swelling      NEW MEDICATIONS STARTED DURING THIS VISIT:  ED Discharge Orders    None          This chart was dictated using voice recognition  software/Dragon. Despite best efforts to proofread, errors can occur which can change the meaning. Any change was purely unintentional.    Enid Derry, PA-C 09/30/20 2229    Sharman Cheek, MD 09/30/20 2306

## 2020-09-30 NOTE — H&P (Addendum)
History and Physical    Lance Morgan YPP:[id number] DOB: [birth year] DOA: 09/30/2020  [facility]P: [facility]   Patient coming from: Home  I have personally briefly reviewed patient's old medical records in [facility]  Chief Complaint: swelling right leg, hx of DVT  HPI: Lance Morgan is a 29 y.o. male with medical history significant for Protein C deficiency diagnosed in 2013 when he was found to have bilateral ophthalmic vein thrombosis, PE and right common femoral vein thrombosis,  with stent placement in the RLE and with recurrent episodes of thrombosis since,  presenting with progressively worsening right lower extremity edema  similar to prior episodes of thrombosis in the leg.  He denies chest pain or shortness of breath.  Patient states he thought he was supposed to use the Eliquis once a day.  Patient had been on Coumadin since 2013 and had briefly tried Eliquis a couple years prior but was switched back to Coumadin  due to cost.  He continued on Coumadin up until July 2021 when he was switched back to Eliquis because of poor follow-up for INR testing and difficulty maintaining INR within the therapeutic range.  He denies shortness of breath or chest pain. ED course: On arrival, vitals were within normal limits.  CBC and BMP WNL, D-dimer elevated at 1143.  Right lower extremity venous Doppler showing DVT involving the right common femoral vein, femoral vein and popliteal vein.  The emergency room provider spoke with Dr. Wyn Quaker who will perform thrombectomy in the a.m.  Hospitalist consulted for admission.   Review of Systems: As per HPI otherwise all other systems on review of systems negative.    Past Medical History:  Diagnosis Date  . Bipolar 2 disorder (HCC)   . DVT (deep venous thrombosis) (HCC)   . Protein C deficiency (HCC)   . Protein S deficiency (HCC)     No past surgical history on file.   reports that he has never smoked. He has never used smokeless  tobacco. He reports current alcohol use. He reports that he does not use drugs.  Allergies  Allergen Reactions  . Demerol [Meperidine Hcl] Hives  . Dilaudid [Hydromorphone Hcl] Hives  . Percocet [Oxycodone-Acetaminophen] Hives    Family History  Problem Relation Age of Onset  . Healthy Mother   . Healthy Father       Prior to Admission medications   Medication Sig Start Date End Date Taking? Authorizing Provider  apixaban (ELIQUIS) 5 MG TABS tablet Take 5 mg by mouth 2 (two) times daily.    [provider]  ARIPiprazole (ABILIFY) 20 MG tablet Take 20 mg by mouth daily.    [provider]    Physical Exam: Vitals:   09/30/20 1634 09/30/20 1635  BP: 136/73   Pulse: 65   Resp: 16   Temp: 98 F (36.7 C)   TempSrc: Oral   SpO2: 98%   Weight:  72.6 kg  Height:  5\' 10"  (1.778 m)     Vitals:   09/30/20 1634 09/30/20 1635  BP: 136/73   Pulse: 65   Resp: 16   Temp: 98 F (36.7 C)   TempSrc: Oral   SpO2: 98%   Weight:  72.6 kg  Height:  5\' 10"  (1.778 m)      Constitutional: Alert and oriented x 3 . Not in any apparent distress HEENT:      Head: Normocephalic and atraumatic.  Eyes: PERLA, EOMI, Conjunctivae are normal. Sclera is non-icteric.       Mouth/Throat: Mucous membranes are moist.       Neck: Supple with no signs of meningismus. Cardiovascular: Regular rate and rhythm. No murmurs, gallops, or rubs. 2+ symmetrical distal pulses are present . No JVD.  Right lower extremity edema  respiratory: Respiratory effort normal .Lungs sounds clear bilaterally. No wheezes, crackles, or rhonchi.  Gastrointestinal: Soft, non tender, and non distended with positive bowel sounds. No rebound or guarding. Genitourinary: No CVA tenderness. Musculoskeletal: Nontender with normal range of motion in all extremities. No cyanosis, or erythema of extremities.  Right lower extremity edema Neurologic:  Face is symmetric. Moving all extremities. No gross focal  neurologic deficits . Skin: Skin is warm, dry.  No rash or ulcers Psychiatric: Mood and affect are normal    Labs on Admission: I have personally reviewed following labs and imaging studies  CBC: Recent Labs  Lab 09/30/20 1642  WBC 7.3  HGB 16.2  HCT 47.9  MCV 91.4  PLT 290   Basic Metabolic Panel: Recent Labs  Lab 09/30/20 1642  NA 137  K 4.3  CL 102  CO2 27  GLUCOSE 85  BUN 15  CREATININE 1.13  CALCIUM 9.0   GFR: Estimated Creatinine Clearance: 99 mL/min (by C-G formula based on SCr of 1.13 mg/dL). Liver Function Tests: No results for input(s): AST, ALT, ALKPHOS, BILITOT, PROT, ALBUMIN in the last 168 hours. No results for input(s): LIPASE, AMYLASE in the last 168 hours. No results for input(s): AMMONIA in the last 168 hours. Coagulation Profile: Recent Labs  Lab 09/30/20 1643  INR 1.2   Cardiac Enzymes: No results for input(s): CKTOTAL, CKMB, CKMBINDEX, TROPONINI in the last 168 hours. BNP (last 3 results) No results for input(s): PROBNP in the last 8760 hours. HbA1C: No results for input(s): HGBA1C in the last 72 hours. CBG: No results for input(s): GLUCAP in the last 168 hours. Lipid Profile: No results for input(s): CHOL, HDL, LDLCALC, TRIG, CHOLHDL, LDLDIRECT in the last 72 hours. Thyroid Function Tests: No results for input(s): TSH, T4TOTAL, FREET4, T3FREE, THYROIDAB in the last 72 hours. Anemia Panel: No results for input(s): VITAMINB12, FOLATE, FERRITIN, TIBC, IRON, RETICCTPCT in the last 72 hours. Urine analysis:    Component Value Date/Time   COLORURINE YELLOW 07/31/2020 1955   APPEARANCEUR CLEAR 07/31/2020 1955   LABSPEC >1.030 (H) 07/31/2020 1955   PHURINE 6.0 07/31/2020 1955   GLUCOSEU NEGATIVE 07/31/2020 1955   HGBUR NEGATIVE 07/31/2020 1955   BILIRUBINUR NEGATIVE 07/31/2020 1955   KETONESUR TRACE (A) 07/31/2020 1955   PROTEINUR NEGATIVE 07/31/2020 1955   NITRITE NEGATIVE 07/31/2020 1955   LEUKOCYTESUR NEGATIVE 07/31/2020 1955     Radiological Exams on Admission: US Venous Img Lower Unilateral Right (DVT)  Result Date: 09/30/2020 CLINICAL DATA:  Leg swelling for 1 week. EXAM: RIGHT LOWER EXTREMITY VENOUS DOPPLER ULTRASOUND TECHNIQUE: Gray-scale sonography with compression, as well as color and duplex ultrasound, were performed to evaluate the deep venous system(s) from the level of the common femoral vein through the popliteal and proximal calf veins. COMPARISON:  None. FINDINGS: VENOUS Stent noted within the right femoral vein. There is occlusive thrombus in the right common femoral vein and the right femoral vein. Nonocclusive thrombus within the popliteal vein. No evidence of DVT within the right posterior tibial vein and peroneal veins. Limited views of the contralateral common femoral vein are unremarkable. IMPRESSION: Deep vein thrombosis involving the right common femoral vein, femoral vein,  and popliteal vein. Findings discussed with Dr. Scotty Court via telephone at 5:18 p.m. 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 Wyn Quaker by telephone -Ffor thrombectomy around lunchtime,per Dr. Wyn Quaker. -Okay to continue Eliquis, prior to thrombectomy per Dr. Wyn Quaker -Eliquis restarted at  10 mg twice daily was 1 week transition to 5 mg twice daily thereafter  Mood disorder of depressed type -Continue Abilify    DVT prophylaxis: Eliquis Code Status: full code  Family Communication:  none  Disposition Plan: Back to previous home environment Consults called: Dr. Wyn Quaker Status: Observation    Lance Baumann MD [facility]     09/30/2020, 8:05 PM

## 2020-09-30 NOTE — ED Triage Notes (Signed)
Pt here with right leg swelling. Pt has hx of blood clots since 2013 so his leg has never returned to its normal size but pt states that it is more swollen than usual. Pt NAD in triage.

## 2020-09-30 NOTE — Progress Notes (Signed)
ANTICOAGULATION CONSULT NOTE  Pharmacy Consult for Eliquis Indication: DVT  Allergies  Allergen Reactions  . Demerol [Meperidine Hcl] Hives  . Dilaudid [Hydromorphone Hcl] Hives  . Percocet [Oxycodone-Acetaminophen] Hives    Patient Measurements: Height: 5\' 10"  (177.8 cm) Weight: 72.6 kg (160 lb) IBW/kg (Calculated) : 73  Vital Signs: Temp: 98 F (36.7 C) (11/01 1634) Temp Source: Oral (11/01 1634) BP: 136/73 (11/01 1634) Pulse Rate: 65 (11/01 1634)  Labs: Recent Labs    09/30/20 1642 09/30/20 1643  HGB 16.2  --   HCT 47.9  --   PLT 290  --   APTT  --  37*  LABPROT  --  15.1  INR  --  1.2  CREATININE 1.13  --     Estimated Creatinine Clearance: 99 mL/min (by C-G formula based on SCr of 1.13 mg/dL).   Medical History: Past Medical History:  Diagnosis Date  . Bipolar 2 disorder (HCC)   . DVT (deep venous thrombosis) (HCC)   . Protein C deficiency (HCC)   . 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 Patient with h/o multiple DVTs in the past. Patient recently switched from warfarin to Eliquis. Of note, he has been taking Eliquis PTA but admits to taking once daily rather than twice daily. Vascular to perform thrombectomy 11/2. Admitting MD discussed with Vascular, do not hold Eliquis prior to procedure. Consult to start/continue Eliquis.  Goal of Therapy:  Monitor platelets by anticoagulation protocol: Yes   Plan:  Eliquis 10 mg BID x 14 doses (patient already received 10 mg this evening as ordered by ED physician) followed by 5 mg BID.  13/2, PharmD 09/30/2020,8:18 PM

## 2020-10-01 ENCOUNTER — Encounter: Payer: Self-pay | Admitting: Vascular Surgery

## 2020-10-01 ENCOUNTER — Other Ambulatory Visit (INDEPENDENT_AMBULATORY_CARE_PROVIDER_SITE_OTHER): Payer: Self-pay | Admitting: Vascular Surgery

## 2020-10-01 ENCOUNTER — Encounter
Admission: EM | Disposition: A | Discharge status: Home / Self Care | Payer: Self-pay | Source: Home / Self Care | Attending: Internal Medicine

## 2020-10-01 DIAGNOSIS — I82401 Acute embolism and thrombosis of unspecified deep veins of right lower extremity: Secondary | ICD-10-CM | POA: Diagnosis not present

## 2020-10-01 DIAGNOSIS — Z86711 Personal history of pulmonary embolism: Secondary | ICD-10-CM | POA: Diagnosis not present

## 2020-10-01 DIAGNOSIS — I87009 Postthrombotic syndrome without complications of unspecified extremity: Secondary | ICD-10-CM

## 2020-10-01 DIAGNOSIS — I82491 Acute embolism and thrombosis of other specified deep vein of right lower extremity: Secondary | ICD-10-CM

## 2020-10-01 DIAGNOSIS — I82411 Acute embolism and thrombosis of right femoral vein: Secondary | ICD-10-CM | POA: Diagnosis present

## 2020-10-01 DIAGNOSIS — R600 Localized edema: Secondary | ICD-10-CM

## 2020-10-01 HISTORY — PX: PERIPHERAL VASCULAR THROMBECTOMY: CATH118306

## 2020-10-01 LAB — HIV ANTIBODY (ROUTINE TESTING W REFLEX): HIV Screen 4th Generation wRfx: NONREACTIVE

## 2020-10-01 SURGERY — PERIPHERAL VASCULAR THROMBECTOMY
Anesthesia: Moderate Sedation | Laterality: Right

## 2020-10-01 MED ORDER — MIDAZOLAM HCL 2 MG/ML PO SYRP: 8.0000 mg | ORAL_SOLUTION | Freq: Once | ORAL | Status: DC | PRN

## 2020-10-01 MED ORDER — CEFAZOLIN SODIUM-DEXTROSE 2-4 GM/100ML-% IV SOLN
INTRAVENOUS | Status: AC
  Administered 2020-10-01: 2 g via INTRAVENOUS
  Filled 2020-10-01: qty 100

## 2020-10-01 MED ORDER — MIDAZOLAM HCL 2 MG/2ML IJ SOLN
INTRAMUSCULAR | Status: DC | PRN
  Administered 2020-10-01: 2 mg via INTRAVENOUS

## 2020-10-01 MED ORDER — FENTANYL CITRATE (PF) 100 MCG/2ML IJ SOLN
INTRAMUSCULAR | Status: DC | PRN
  Administered 2020-10-01: 50 ug via INTRAVENOUS

## 2020-10-01 MED ORDER — DIPHENHYDRAMINE HCL 50 MG/ML IJ SOLN: 50.0000 mg | Freq: Once | INTRAMUSCULAR | Status: DC | PRN

## 2020-10-01 MED ORDER — CEFAZOLIN SODIUM-DEXTROSE 2-4 GM/100ML-% IV SOLN: 2.0000 g | Freq: Once | INTRAVENOUS | Status: AC

## 2020-10-01 MED ORDER — FAMOTIDINE 20 MG PO TABS: 40.0000 mg | ORAL_TABLET | Freq: Once | ORAL | Status: DC | PRN

## 2020-10-01 MED ORDER — HYDROMORPHONE HCL 1 MG/ML IJ SOLN: 1.0000 mg | Freq: Once | INTRAMUSCULAR | Status: DC | PRN

## 2020-10-01 MED ORDER — DIPHENHYDRAMINE HCL 50 MG/ML IJ SOLN
INTRAMUSCULAR | Status: DC | PRN
  Administered 2020-10-01: 50 mg via INTRAVENOUS

## 2020-10-01 MED ORDER — SODIUM CHLORIDE 0.9 % IV SOLN: INTRAVENOUS | Status: DC

## 2020-10-01 MED ORDER — FENTANYL CITRATE (PF) 100 MCG/2ML IJ SOLN
INTRAMUSCULAR | Status: AC
  Filled 2020-10-01: qty 2

## 2020-10-01 MED ORDER — CEFAZOLIN SODIUM-DEXTROSE 2-4 GM/100ML-% IV SOLN
INTRAVENOUS | Status: AC
  Filled 2020-10-01: qty 100

## 2020-10-01 MED ORDER — HEPARIN SODIUM (PORCINE) 1000 UNIT/ML IJ SOLN
INTRAMUSCULAR | Status: AC
  Filled 2020-10-01: qty 1

## 2020-10-01 MED ORDER — ONDANSETRON HCL 4 MG/2ML IJ SOLN: 4.0000 mg | Freq: Four times a day (QID) | INTRAMUSCULAR | Status: DC | PRN

## 2020-10-01 MED ORDER — IODIXANOL 320 MG/ML IV SOLN
INTRAVENOUS | Status: DC | PRN
  Administered 2020-10-01: 30 mL via INTRAVENOUS

## 2020-10-01 MED ORDER — METHYLPREDNISOLONE SODIUM SUCC 125 MG IJ SOLR: 125.0000 mg | Freq: Once | INTRAMUSCULAR | Status: DC | PRN

## 2020-10-01 MED ORDER — MIDAZOLAM HCL 5 MG/5ML IJ SOLN
INTRAMUSCULAR | Status: AC
  Filled 2020-10-01: qty 5

## 2020-10-01 MED ORDER — DIPHENHYDRAMINE HCL 50 MG/ML IJ SOLN
INTRAMUSCULAR | Status: AC
  Filled 2020-10-01: qty 1

## 2020-10-01 MED ORDER — APIXABAN 5 MG PO TABS: 5.0000 mg | ORAL_TABLET | Freq: Two times a day (BID) | ORAL | 2 refills | Status: AC

## 2020-10-01 SURGICAL SUPPLY — 10 items
CANNULA 5F STIFF (CANNULA) ×2 IMPLANT
CATH BEACON 5 .035 40 KMP TP (CATHETERS) IMPLANT
CATH BEACON 5 .038 40 KMP TP (CATHETERS) ×3
CATH CXI 4F 90 DAV (CATHETERS) ×2 IMPLANT
COVER PROBE U/S 5X48 (MISCELLANEOUS) ×2 IMPLANT
GLIDEWIRE ADV .035X180CM (WIRE) ×2 IMPLANT
PACK ANGIOGRAPHY (CUSTOM PROCEDURE TRAY) ×3 IMPLANT
SHEATH BRITE TIP 6FRX11 (SHEATH) ×2 IMPLANT
SHEATH PINNACLE 11FRX10 (SHEATH) ×2 IMPLANT
WIRE J 3MM .035X145CM (WIRE) ×2 IMPLANT

## 2020-10-01 NOTE — Consult Note (Signed)
Hematology/Oncology Consult note [facility] Telephone:([phone] [phone]  Patient Care Team: [facility] as PCP - General   Name of the patient: Lance Morgan  [id number]  12/21/1990   Date of visit: 10/01/20 REASON FOR COSULTATION:  Recurrent DVT History of presenting illness-  28 y.o. male with PMH listed at below who presents to ER for evaluation of progressively worsening of right lower extremity edema.  He denies any chest pain or shortness of breath.  He just came back from trip from [place].  He usually uses a compression sleeve on his right lower extremity and has not been using during the trip.  Patient has significant history of thrombosis.  Initial episode was in 2013 initially diagnosed at [facility] (Dr Remus Blake) [facility].  Patient was admitted at [facility].  I reviewed patient's past medical records.Per 02/29/2012 note patient had a bilateral superior ophthalmic vein thrombosis/right lower extremity DVT/subclinical pulmonary emboli/possible hypercoagulable state.  It was not clear at that time what was precipitating patient's hypercoagulable state.  Per outside medical records, protein C was less than 5%, however it was not clear of the significance of this as the level can be consumed due to the extensive clot burden.  Patient had MRI/MRA with findings of bilateral superior ophthalmic vein thrombosis no venous sinus thrombosis.  he had a mechanical thrombectomy and external iliac balloon angioplasty and placement of a stent to the common femoral vein. As well as IVC filter placement Patient did bone marrow biopsy on 02/29/2012 which showed no blasts.  during that admission, he was also treated with antibiotics for sepsis/bacteremia from superinfected thrombus may be contributory factor. Patient was later discharged on coumadin with Lovenox bridging  He then follows up with Dr.Crew at [facility] from May, 2013- August 2013 for anticoagulation  management.  It appears that he was switched to Lovenox at some point. Patient reports that he was not taking anticoagulation for a few years.  Patient presented to [facility] emergency room on 01/16/2019 due to worsening swelling of the left ankle.  At that time patient was not on any anticoagulation.  He has also not followed up with his hematologist for 5 years at that point. Left lower extremity ultrasound showed superficial thrombophlebitis.  Emergency room recommend patient to be started on Eliquis and follow-up with hematologist.  Patient establish care with [place] vascular surgeon Dr.Geersen on 02/10/2019 and followed up since then, Patient switched from Eliquis to warfarin due to cost issue. 07/25/2019 patient had a telemetry medicine visit with [place] hematology Dr. Drema Halon. Factor V Leiden mutation negative, negative lupus anticoagulant panel JAK2 V6 76F and BCR ABL 1 mutation analysis were done and results were not available to me.   Prothrombin gene mutation negative He follows with family doctor for Coumadin dosing) note, his compliance has been an issue. 07/02/2020 patient was switched back to Eliquis 5 mg twice daily  Patient reports that he has been only taking Eliquis once daily.  Denies any bleeding events. He had venogram today by Dr. Lucky Cowboy.  Hematology oncology was consulted for evaluation.  #Venogram showed chronic appearing thrombus throughout the right side femoral vein/common femoral vein with extensive collaterals reconstituting the proximal common femoral vein.  The iliac vein and IVC did not have any thrombus and appeared to be patent although feeling was faint.  2 previously placed stent in the femoral vein and common femoral vein  Patient's mother is at bedside. Patient reports right lower extremity swelling has improved  since leg elevation.  Denies any lower extremity pain, shortness of breath, chest pain.  Review of Systems  Constitutional: Negative for appetite change,  chills, fatigue, fever and unexpected weight change.  HENT:   Negative for hearing loss and voice change.   Eyes: Negative for eye problems and icterus.  Respiratory: Negative for chest tightness, cough and shortness of breath.   Cardiovascular: Negative for chest pain and leg swelling.  Gastrointestinal: Negative for abdominal distention and abdominal pain.  Endocrine: Negative for hot flashes.  Genitourinary: Negative for difficulty urinating, dysuria and frequency.   Musculoskeletal: Negative for arthralgias.       Right lower extremity swelling  Skin: Negative for itching and rash.  Neurological: Negative for light-headedness and numbness.  Hematological: Negative for adenopathy. Does not bruise/bleed easily.  Psychiatric/Behavioral: Negative for confusion.    Allergies  Allergen Reactions  . Demerol [Meperidine Hcl] Hives  . Dilaudid [Hydromorphone Hcl] Hives  . Percocet [Oxycodone-Acetaminophen] Hives    Patient Active Problem List   Diagnosis Date Noted  . Right femoral vein DVT ([place]) 10/01/2020  . 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 Protein C deficiency ([place]) 09/30/2020  . History of pulmonary embolism 09/30/2020  . Mood disorder of depressed type 02/19/2018  . Long term (current) use of anticoagulants 05/03/2012     Past Medical History:  Diagnosis Date  . Bipolar 2 disorder ([place])   . DVT (deep venous thrombosis) ([place])   . Protein C deficiency ([place])   . 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 PERIPHERAL VASCULAR THROMBECTOMY Right 10/01/2020   Procedure: PERIPHERAL VASCULAR THROMBECTOMY;  Surgeon: Algernon Huxley, MD;  Location: [place] [facility];  Service: Cardiovascular;  Laterality: Right;    Social History   Socioeconomic History  . Marital status: Single    Spouse name: Not on file  . Number of children: Not on file  . Years of education: Not on file   . Highest education level: Not on file  Occupational History  . Not on file  Tobacco Use  . Smoking status: Never Smoker  . Smokeless tobacco: Never Used  Vaping Use  . Vaping Use: Never used  Substance and Sexual Activity  . Alcohol use: Yes  . Drug use: Never  . Sexual activity: Not on file  Other Topics Concern  . Not on file  Social History Narrative  . Not on file   Social Determinants of Health   Financial Resource Strain:   . Difficulty of Paying Living Expenses: Not on file  Food Insecurity:   . Worried About Charity fundraiser in the Last Year: Not on file  . Ran Out of Food in the Last Year: Not on file  Transportation Needs:   . Lack of Transportation (Medical): Not on file  . Lack of Transportation (Non-Medical): Not on file  Physical Activity:   . Days of Exercise per Week: Not on file  . Minutes of Exercise per Session: Not on file  Stress:   . Feeling of Stress : Not on file  Social Connections:   . Frequency of Communication with Friends and Family: Not on file  . Frequency of Social Gatherings with Friends and Family: Not on file  . Attends Religious Services: Not on file  . Active Member of Clubs or Organizations: Not on file  . Attends Archivist Meetings: Not on  file  . Marital Status: Not on file  Intimate Partner Violence:   . Fear of Current or Ex-Partner: Not on file  . Emotionally Abused: Not on file  . Physically Abused: Not on file  . Sexually Abused: Not on file     Family History  Problem Relation Age of Onset  . Healthy Mother   . Healthy Father      Current Facility-Administered Medications:  .  0.9 %  sodium chloride infusion, , Intravenous, Continuous, Dew, Erskine Squibb, MD, Last Rate: 75 mL/hr at 10/01/20 1140, New Bag at 10/01/20 1140 .  apixaban (ELIQUIS) tablet 10 mg, 10 mg, Oral, BID, 10 mg at 10/01/20 0953 **FOLLOWED BY** [START ON 10/07/2020] apixaban (ELIQUIS) tablet 5 mg, 5 mg, Oral, BID, Dew, Erskine Squibb, MD .   ceFAZolin (ANCEF) 2-4 GM/100ML-% IVPB, , , ,  .  diphenhydrAMINE (BENADRYL) 50 MG/ML injection, , , ,  .  fentaNYL (SUBLIMAZE) 100 MCG/2ML injection, , , ,  .  HYDROmorphone (DILAUDID) injection 1 mg, 1 mg, Intravenous, Once PRN, Lucky Cowboy, Erskine Squibb, MD .  midazolam (VERSED) 5 MG/5ML injection, , , ,  .  ondansetron (ZOFRAN) injection 4 mg, 4 mg, Intravenous, Q6H PRN, Algernon Huxley, MD   Physical exam:  Vitals:   10/01/20 1400 10/01/20 1415 10/01/20 1430 10/01/20 1456  BP: 107/69  102/61 114/67  Pulse: 64 (!) 59 60 65  Resp: '16 17 14 14  ' Temp:    98.2 F (36.8 C)  TempSrc:    Oral  SpO2: 99%  99%   Weight:      Height:       Physical Exam Constitutional:      General: He is not in acute distress.    Appearance: He is not diaphoretic.  HENT:     Head: Normocephalic and atraumatic.     Nose: Nose normal.     Mouth/Throat:     Pharynx: No oropharyngeal exudate.  Eyes:     General: No scleral icterus.    Pupils: Pupils are equal, round, and reactive to light.  Cardiovascular:     Rate and Rhythm: Normal rate and regular rhythm.     Heart sounds: No murmur heard.   Pulmonary:     Effort: Pulmonary effort is normal. No respiratory distress.     Breath sounds: No rales.  Chest:     Chest wall: No tenderness.  Abdominal:     General: There is no distension.     Palpations: Abdomen is soft.     Tenderness: There is no abdominal tenderness.  Musculoskeletal:        General: Normal range of motion.     Cervical back: Normal range of motion and neck supple.     Comments: Trace right lower extremity swelling, slightly warmer than left lower extremity.  Skin:    General: Skin is warm and dry.     Findings: No erythema.  Neurological:     Mental Status: He is alert and oriented to person, place, and time.     Cranial Nerves: No cranial nerve deficit.     Motor: No abnormal muscle tone.     Coordination: Coordination normal.  Psychiatric:        Mood and Affect: Affect normal.          CMP Latest Ref Rng & Units 09/30/2020  Glucose 70 - 99 mg/dL 85  BUN 6 - 20 mg/dL 15  Creatinine 0.61 - 1.24 mg/dL 1.13  Sodium  135 - 145 mmol/L 137  Potassium 3.5 - 5.1 mmol/L 4.3  Chloride 98 - 111 mmol/L 102  CO2 22 - 32 mmol/L 27  Calcium 8.9 - 10.3 mg/dL 9.0  Total Protein 6.5 - 8.1 g/dL -  Total Bilirubin 0.3 - 1.2 mg/dL -  Alkaline Phos 38 - 126 U/L -  AST 15 - 41 U/L -  ALT 0 - 44 U/L -   CBC Latest Ref Rng & Units 09/30/2020  WBC 4.0 - 10.5 K/uL 7.3  Hemoglobin 13.0 - 17.0 g/dL 16.2  Hematocrit 39 - 52 % 47.9  Platelets 150 - 400 K/uL 290    RADIOGRAPHIC STUDIES: I have personally reviewed the radiological images as listed and agreed with the findings in the report. PERIPHERAL VASCULAR CATHETERIZATION  Result Date: 10/01/2020 See op note  US Venous Img Lower Unilateral Right (DVT)  Result Date: 09/30/2020 CLINICAL DATA:  Leg swelling for 1 week. EXAM: RIGHT LOWER EXTREMITY VENOUS DOPPLER ULTRASOUND TECHNIQUE: Gray-scale sonography with compression, as well as color and duplex ultrasound, were performed to evaluate the deep venous system(s) from the level of the common femoral vein through the popliteal and proximal calf veins. COMPARISON:  None. FINDINGS: VENOUS Stent noted within the right femoral vein. There is occlusive thrombus in the right common femoral vein and the right femoral vein. Nonocclusive thrombus within the popliteal vein. No evidence of DVT within the right posterior tibial vein and peroneal veins. Limited views of the contralateral common femoral vein are unremarkable. IMPRESSION: Deep vein thrombosis involving the right common femoral vein, femoral vein, and popliteal vein. Findings discussed with Dr. Joni Fears via telephone at 5:18 p.m. Electronically Signed   By: Margaretha Sheffield MD   On: 09/30/2020 17:21    Assessment and plan- Patient is a 28 y.o. male with history of extensive unprovoked deep vein thrombosis presents for evaluation of  progressively worse right lower extremity swelling.  Vascular surgeon Dr. Lucky Cowboy operative note was reviewed.  He was found to have extensive chronic appearing thrombus throughout the right side femoral vein and common femoral vein with collaterals.  No endovascular intervention was done. Given that the right lower extremity appearance is chronic, I recommend patient to take Eliquis 5 mg twice daily and to follow-up outpatient.  Patient is an established patient with vascular surgeon and hematology at [facility] and he prefers to continue follow-up there. I discussed with him about the importance of compliance. 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  Recommend compression stocking.  Continue follow-up with vascular surgeon. Regarding to the questionable protein C deficiency, this diagnosis is not confirmed.  Initial low protein C level after his initial extensive thrombosis events can be from consumption and this was never further clarified.  I recommend patient to discuss with his hematologist at [facility] and repeat testing.  Patient voices understanding about the recommendation. I discussed recommendation with hospitalist Dr. Kurtis Bushman.   Thank you for allowing me to participate in the care of this patient.   Earlie Server, MD, PhD Hematology Oncology [facility] [place] [facility] at [facility] Pager- [phone] 10/01/2020

## 2020-10-01 NOTE — Discharge Summary (Addendum)
Lance Morgan ZOX:[id number] DOB: [birth year] DOA: 09/30/2020  PCP: [facility]  Admit date: 09/30/2020 Discharge date: 10/01/2020  Admitted From: home Disposition:  home  Recommendations for Outpatient Follow-up:  1. Follow up with PCP in 1 week 2. Please obtain BMP/CBC in one week 3. Follow up with hematology in one week Dr. Cathie Hoops 4. F/u with vascular surgery in 2 weeks, Dr. Wyn Quaker     Discharge Condition:Stable CODE STATUS:full  Diet recommendation: Regular  Brief/Interim Summary: Lance Morgan is a 29 y.o. male with medical history significant for ??Protein C deficiency diagnosed in 2013 when he was found to have bilateral ophthalmic vein thrombosis, PE and right common femoral vein thrombosis,  with stent placement in the RLE and with recurrent episodes of thrombosis since,  presenting with progressively worsening right lower extremity edema  similar to prior episodes of thrombosis in the leg.  He denied chest pain or shortness of breath.  Patient stated he thought he was supposed to use the Eliquis once a day.  Patient had been on Coumadin since 2013 and had briefly tried Eliquis a couple years prior but was switched back to Coumadin  due to cost.  He continued on Coumadin up until July 2021 when he was switched back to Eliquis because of poor follow-up for INR testing and difficulty maintaining INR within the therapeutic range.   Patient had venous ultrasound revealed deep vein thrombosis involving the right common femoral vein,femoral vein, and popliteal vein.vascular and oncology were consulted.  Patient underwent a venogram today by Dr. Wyn Quaker and was found with chronic appearing thrombus throughout the right side femoral vein and common femoral vein with extensive collaterals reconstituting the proximal common femoral vein.  The iliac and IVC did not have any thrombus and appeared to be patent although filling was faint.  There were 2 previously placed stents in the femoral  vein and common femoral vein that had extensive fractures and were not going to have any reasonable patency.  No further intervention was done.  From vascular standpoint (via chat) pt was cleared for discharge . Spoke to hematology, since pt has chronic thrombus , can discharge with Eliquis  bid. Case manager was notified to give coupon to patient. Also hematology would like further workup as outpatient as his diagnosis of Protein C deficiency is not clear. 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   ARIPiprazole 20 MG tablet Commonly known as: ABILIFY Take 20 mg by mouth daily.   multivitamin tablet Take 1 tablet by mouth daily.       Follow-up Information    Dew, Marlow Baars, MD Follow up in 2 week(s).   Specialties: Vascular Surgery, Radiology, Interventional Cardiology Contact information: [address] [place] [address] [phone]        Rickard Patience,  MD Follow up in 1 week(s).   Specialty: Oncology Contact information: [address] [place] [address] [phone]        [facility] Follow up in 1 week(s).   Contact information: [address] [address] [place] [address] [phone]              Allergies  Allergen Reactions  . Demerol [Meperidine Hcl] Hives  . Dilaudid [Hydromorphone Hcl] Hives  . Percocet  [Oxycodone-Acetaminophen] Hives    Consultations:  Vascular surgery  Hematology   Procedures/Studies: PERIPHERAL VASCULAR CATHETERIZATION  Result Date: 10/01/2020 See op note  US Venous Img Lower Unilateral Right (DVT)  Result Date: 09/30/2020 CLINICAL DATA:  Leg swelling for 1 week. EXAM: RIGHT LOWER EXTREMITY VENOUS DOPPLER ULTRASOUND TECHNIQUE: Gray-scale sonography with compression, as well as color and duplex ultrasound, were performed to evaluate the deep venous system(s) from the level of the common femoral vein through the popliteal and proximal calf veins. COMPARISON:  None. FINDINGS: VENOUS Stent noted within the right femoral vein. There is occlusive thrombus in the right common femoral vein and the right femoral vein. Nonocclusive thrombus within the popliteal vein. No evidence of DVT within the right posterior tibial vein and peroneal veins. Limited views of the contralateral common femoral vein are unremarkable. IMPRESSION: Deep vein thrombosis involving the right common femoral vein, femoral vein, and popliteal vein. Findings discussed with Dr. Scotty Court via telephone at 5:18 p.m. Electronically Signed   By: Feliberto Harts MD   On: 09/30/2020 17:21       Subjective: No chest pain no shortness of breath  Discharge Exam: Vitals:   10/01/20 1430 10/01/20 1456  BP: 102/61 114/67  Pulse: 60 65  Resp: 14 14  Temp:  98.2 F (36.8 C)  SpO2: 99%    Vitals:   10/01/20 1400 10/01/20 1415 10/01/20 1430 10/01/20 1456  BP: 107/69  102/61 114/67  Pulse: 64 (!) 59 60 65  Resp: 16 17 14 14   Temp:    98.2 F (36.8 C)  TempSrc:    Oral  SpO2: 99%  99%   Weight:      Height:        General: Pt is alert, awake, not in acute distress Cardiovascular: RRR, S1/S2 +, no rubs, no gallops Respiratory: CTA bilaterally, no wheezing, no rhonchi Abdominal: Soft, NT, ND, bowel sounds + Extremities: RLE edema >L    The results of significant diagnostics from this  hospitalization (including imaging, microbiology, ancillary and laboratory) are listed below for reference.     Microbiology: Recent Results (from the past 240 hour(s))  Respiratory Panel by RT PCR (Flu A&B, Covid) - Nasopharyngeal Swab     Status: None   Collection Time: 09/30/20  7:26 PM   Specimen: Nasopharyngeal Swab  Result Value Ref Range Status   SARS Coronavirus 2 by RT PCR NEGATIVE NEGATIVE Final    Comment: (NOTE) SARS-CoV-2 target nucleic acids are NOT DETECTED.  The SARS-CoV-2 RNA is generally detectable in upper respiratoy specimens during the acute phase of infection. The lowest concentration of SARS-CoV-2 viral copies this assay can detect is 131 copies/mL. A negative result does not preclude SARS-Cov-2 infection and should not be used as the sole basis for treatment or other patient management decisions. A negative result may occur with  improper specimen collection/handling, submission of specimen other than nasopharyngeal swab, presence of viral mutation(s) within the areas targeted by this assay, and inadequate number of viral copies (<131 copies/mL). A negative  result must be combined with clinical observations, patient history, and epidemiological information. The expected result is Negative.  Fact Sheet for Patients:  https://www.moore.com/  Fact Sheet for Healthcare Providers:  https://www.young.biz/  This test is no t yet approved or cleared by the [place] FDA and  has been authorized for detection and/or diagnosis of SARS-CoV-2 by FDA under an Emergency Use Authorization (EUA). This EUA will remain  in effect (meaning this test can be used) for the duration of the COVID-19 declaration under Section 564(b)(1) of the Act, 21 U.S.C. section 360bbb-3(b)(1), unless the authorization is terminated or revoked sooner.     Influenza A by PCR NEGATIVE NEGATIVE Final   Influenza B by PCR NEGATIVE NEGATIVE Final     Comment: (NOTE) The Xpert Xpress SARS-CoV-2/FLU/RSV assay is intended as an aid in  the diagnosis of influenza from Nasopharyngeal swab specimens and  should not be used as a sole basis for treatment. Nasal washings and  aspirates are unacceptable for Xpert Xpress SARS-CoV-2/FLU/RSV  testing.  Fact Sheet for Patients: https://www.moore.com/  Fact Sheet for Healthcare Providers: https://www.young.biz/  This test is not yet approved or cleared by the [place] FDA and  has been authorized for detection and/or diagnosis of SARS-CoV-2 by  FDA under an Emergency Use Authorization (EUA). This EUA will remain  in effect (meaning this test can be used) for the duration of the  Covid-19 declaration under Section 564(b)(1) of the Act, 21  U.S.C. section 360bbb-3(b)(1), unless the authorization is  terminated or revoked. Performed at [facility], [address] [place], [place] [address]   MRSA PCR Screening     Status: None   Collection Time: 09/30/20  9:29 PM   Specimen: Nasopharyngeal  Result Value Ref Range Status   MRSA by PCR NEGATIVE NEGATIVE Final    Comment:        The GeneXpert MRSA Assay (FDA approved for NASAL specimens only), is one component of a comprehensive MRSA colonization surveillance program. It is not intended to diagnose MRSA infection nor to guide or monitor treatment for MRSA infections. Performed at [facility], [address] [place], [place] [address]      Labs: BNP (last 3 results) No results for input(s): BNP in the last 8760 hours. Basic Metabolic Panel: Recent Labs  Lab 09/30/20 1642  NA 137  K 4.3  CL 102  CO2 27  GLUCOSE 85  BUN 15  CREATININE 1.13  CALCIUM 9.0   Liver Function Tests: No results for input(s): AST, ALT, ALKPHOS, BILITOT, PROT, ALBUMIN in the last 168 hours. No results for input(s): LIPASE, AMYLASE in the last 168 hours. No results for input(s): AMMONIA  in the last 168 hours. CBC: Recent Labs  Lab 09/30/20 1642  WBC 7.3  HGB 16.2  HCT 47.9  MCV 91.4  PLT 290   Cardiac Enzymes: No results for input(s): CKTOTAL, CKMB, CKMBINDEX, TROPONINI in the last 168 hours. BNP: Invalid input(s): POCBNP CBG: No results for input(s): GLUCAP in the last 168 hours. D-Dimer No results for input(s): DDIMER in the last 72 hours. Hgb A1c No results for input(s): HGBA1C in the last 72 hours. Lipid Profile No results for input(s): CHOL, HDL, LDLCALC, TRIG, CHOLHDL, LDLDIRECT in the last 72 hours. Thyroid function studies No results for input(s): TSH, T4TOTAL, T3FREE, THYROIDAB in the last 72 hours.  Invalid input(s): FREET3 Anemia work up No results for input(s): VITAMINB12, FOLATE, FERRITIN, TIBC, IRON, RETICCTPCT in the last 72 hours. Urinalysis  Component Value Date/Time   COLORURINE YELLOW 07/31/2020 1955   APPEARANCEUR CLEAR 07/31/2020 1955   LABSPEC >1.030 (H) 07/31/2020 1955   PHURINE 6.0 07/31/2020 1955   GLUCOSEU NEGATIVE 07/31/2020 1955   HGBUR NEGATIVE 07/31/2020 1955   BILIRUBINUR NEGATIVE 07/31/2020 1955   KETONESUR TRACE (A) 07/31/2020 1955   PROTEINUR NEGATIVE 07/31/2020 1955   NITRITE NEGATIVE 07/31/2020 1955   LEUKOCYTESUR NEGATIVE 07/31/2020 1955   Sepsis Labs Invalid input(s): PROCALCITONIN,  WBC,  LACTICIDVEN Microbiology Recent Results (from the past 240 hour(s))  Respiratory Panel by RT PCR (Flu A&B, Covid) - Nasopharyngeal Swab     Status: None   Collection Time: 09/30/20  7:26 PM   Specimen: Nasopharyngeal Swab  Result Value Ref Range Status   SARS Coronavirus 2 by RT PCR NEGATIVE NEGATIVE Final    Comment: (NOTE) SARS-CoV-2 target nucleic acids are NOT DETECTED.  The SARS-CoV-2 RNA is generally detectable in upper respiratoy specimens during the acute phase of infection. The lowest concentration of SARS-CoV-2 viral copies this assay can detect is 131 copies/mL. A negative result does not preclude  SARS-Cov-2 infection and should not be used as the sole basis for treatment or other patient management decisions. A negative result may occur with  improper specimen collection/handling, submission of specimen other than nasopharyngeal swab, presence of viral mutation(s) within the areas targeted by this assay, and inadequate number of viral copies (<131 copies/mL). A negative result must be combined with clinical observations, patient history, and epidemiological information. The expected result is Negative.  Fact Sheet for Patients:  https://www.moore.com/https://www.fda.gov/media/142436/download  Fact Sheet for Healthcare Providers:  https://www.young.biz/https://www.fda.gov/media/142435/download  This test is no t yet approved or cleared by the [place] FDA and  has been authorized for detection and/or diagnosis of SARS-CoV-2 by FDA under an Emergency Use Authorization (EUA). This EUA will remain  in effect (meaning this test can be used) for the duration of the COVID-19 declaration under Section 564(b)(1) of the Act, 21 U.S.C. section 360bbb-3(b)(1), unless the authorization is terminated or revoked sooner.     Influenza A by PCR NEGATIVE NEGATIVE Final   Influenza B by PCR NEGATIVE NEGATIVE Final    Comment: (NOTE) The Xpert Xpress SARS-CoV-2/FLU/RSV assay is intended as an aid in  the diagnosis of influenza from Nasopharyngeal swab specimens and  should not be used as a sole basis for treatment. Nasal washings and  aspirates are unacceptable for Xpert Xpress SARS-CoV-2/FLU/RSV  testing.  Fact Sheet for Patients: https://www.moore.com/https://www.fda.gov/media/142436/download  Fact Sheet for Healthcare Providers: https://www.young.biz/https://www.fda.gov/media/142435/download  This test is not yet approved or cleared by the [place] FDA and  has been authorized for detection and/or diagnosis of SARS-CoV-2 by  FDA under an Emergency Use Authorization (EUA). This EUA will remain  in effect (meaning this test can be used) for the duration of the   Covid-19 declaration under Section 564(b)(1) of the Act, 21  U.S.C. section 360bbb-3(b)(1), unless the authorization is  terminated or revoked. Performed at [facility], [address] [place], [place] [id number]   MRSA PCR Screening     Status: None   Collection Time: 09/30/20  9:29 PM   Specimen: Nasopharyngeal  Result Value Ref Range Status   MRSA by PCR NEGATIVE NEGATIVE Final    Comment:        The GeneXpert MRSA Assay (FDA approved for NASAL specimens only), is one component of a comprehensive MRSA colonization surveillance program. It is not intended to diagnose MRSA infection nor to guide or monitor treatment  for MRSA infections. Performed at [facility], [address], [place], [place] [address]      Time coordinating discharge: Over 30 minutes  SIGNED:   Lynn Ito, MD  [facility] 10/01/2020, 3:56 PM Pager   If 7PM-7AM, please contact night-coverage [email or web address] Password [email or web address]

## 2020-10-01 NOTE — Op Note (Signed)
VEIN AND VASCULAR SURGERY   OPERATIVE NOTE   PRE-OPERATIVE DIAGNOSIS: extensive right leg DVT  POST-OPERATIVE DIAGNOSIS: same with chronic appearing occlusion of his deep venous system including 2 previous stents in his common femoral vein and femoral vein that were fractured and occluded  PROCEDURE: 1.   US guidance for vascular access to right popliteal vein 2.   Catheter placement into right femoral vein from right popliteal vein 3.   IVC gram and right lower extremity venogram     SURGEON: Festus Barren, MD  ASSISTANT(S): none  ANESTHESIA: local with moderate conscious sedation for 42 minutes using 2 mg of Versed and 50 mcg of Fentanyl  ESTIMATED BLOOD LOSS: Minimal  FINDING(S): 1.   Chronic appearing thrombus throughout the right side femoral vein and common femoral vein with extensive collaterals reconstituting the proximal common femoral vein.  The iliac vein and IVC did not have any thrombus and appeared to be patent although filling was faint.  There were 2 previously placed stents in the femoral vein and common femoral vein that had extensive fractures and were not going to have any reasonable patency.  SPECIMEN(S):  none  INDICATIONS:    Patient is a 29 y.o. male who presents with recurrent right leg pain and swelling and recurrent right leg DVT on duplex.  Patient has marked leg swelling and pain.  Venous intervention is performed to reduce the symtpoms and avoid long term postphlebitic symptoms.    DESCRIPTION: After obtaining full informed written consent, the patient was brought back to the vascular suite and placed supine upon the table. Moderate conscious sedation was administered during a face to face encounter with the patient throughout the procedure with my supervision of the RN administering medicines and monitoring the patient's vital signs, pulse oximetry, telemetry and mental status throughout from the start of the procedure until the patient was taken  to the recovery room.  After obtaining adequate anesthesia, the patient was prepped and draped in the standard fashion.    The patient was then placed into the prone position.  The right popliteal vein was then accessed under direct ultrasound guidance without difficulty with a micropuncture needle and a permanent image was recorded.  I then upsized to an 6Fr sheath over a J wire.  Imaging showed extensive DVT with minimal flow proximal to the popliteal vein.  There were 2 stents in the right femoral vein and common femoral vein that were fractured and occluded.   Imaging through the sheath showed that there was reconstitution of the common femoral vein through collaterals just above the previously placed stent and there was flow in the iliac vein and IVC did appear to be patent although imaging was poor and somewhat faint due to the slow flow through collaterals.  I then used an advantage wire and a [place] and a CXI catheter to get up into the femoral vein to try to get into the occluded stents.  I could never get into the occluded stents but can get around them but this did not appear to be a good path for anything that would be useful for intervention.  I then went through some collaterals laterally up to near the junction of the common femoral vein and superficial femoral vein.  The CXI was taken up there and selective imaging showed the collaterals filling the common femoral vein and iliac veins a little bit better.  The vast majority of the occlusion was chronic thrombus and I am not sure there  was any acute thrombus.  Without being able to get into the true lumen or the fractured stents, there was really no role for intervention today.  He will need to be treated with anticoagulation.  I then elected to terminate the procedure.  The sheath was removed and a dressing was placed.  She was taken to the recovery room in stable condition having tolerated the procedure well.    COMPLICATIONS: None  CONDITION:  Stable  Festus Barren 10/01/2020 1:37 PM

## 2020-10-01 NOTE — Care Management (Signed)
Eliqus $10 copay card provided as well as free 30 day card

## 2020-10-01 NOTE — Consult Note (Signed)
[facility] Vascular Consult Note  MRN : [id number]  Lance Morgan is a 29 y.o. (01/04/1991) male who presents with chief complaint of  Chief Complaint  Patient presents with  . Leg Swelling   History of Present Illness: Lance Morgan is a 29 y.o. male with medical history significant for Protein C deficiency diagnosed in 2013 when he was found to have bilateral ophthalmic vein thrombosis, PE and right common femoral vein thrombosis,  with stent placement in the RLE and with recurrent episodes of thrombosis since,  presenting with progressively worsening right lower extremity edema  similar to prior episodes of thrombosis in the leg.  He denies chest pain or shortness of breath.  Patient states he thought he was supposed to use the Eliquis once a day.  Patient had been on Coumadin since 2013 and had briefly tried Eliquis a couple years prior but was switched back to Coumadin  due to cost.  He continued on Coumadin up until July 2021 when he was switched back to Eliquis because of poor follow-up for INR testing and difficulty maintaining INR within the therapeutic range.  He denies shortness of breath or chest pain.  On arrival, vitals were within normal limits.  CBC and BMP WNL, D-dimer elevated at 1143.  Right lower extremity venous Doppler showing DVT involving the right common femoral vein, femoral vein and popliteal vein.    Vascular surgery was consulted by Dr. Para March for possible endovascular intervention.   Current Facility-Administered Medications  Medication Dose Route Frequency Provider Last Rate Last Admin  . 0.9 %  sodium chloride infusion   Intravenous Continuous Yovana Scogin A, PA-C      . apixaban (ELIQUIS) tablet 10 mg  10 mg Oral BID Andris Baumann, MD   10 mg at 10/01/20 0953   Followed by  . [START ON 10/07/2020] apixaban (ELIQUIS) tablet 5 mg  5 mg Oral BID Andris Baumann, MD       Past Medical History:  Diagnosis Date  . Bipolar 2  disorder (HCC)   . DVT (deep venous thrombosis) (HCC)   . Protein C deficiency (HCC)   . Protein S deficiency (HCC)    History reviewed. No pertinent surgical history.  Social History Social History   Tobacco Use  . Smoking status: Never Smoker  . Smokeless tobacco: Never Used  Vaping Use  . Vaping Use: Never used  Substance Use Topics  . Alcohol use: Yes  . Drug use: Never   Family History Family History  Problem Relation Age of Onset  . Healthy Mother   . Healthy Father   Denies family history of clotting disorders, peripheral artery disease or venous disease  Allergies  Allergen Reactions  . Demerol [Meperidine Hcl] Hives  . Dilaudid [Hydromorphone Hcl] Hives  . Percocet [Oxycodone-Acetaminophen] Hives   REVIEW OF SYSTEMS (Negative unless checked)  Constitutional: [] Weight loss  [] Fever  [] Chills Cardiac: [] Chest pain   [] Chest pressure   [] Palpitations   [] Shortness of breath when laying flat   [] Shortness of breath at rest   [] Shortness of breath with exertion. Vascular:  [] Pain in legs with walking   [] Pain in legs at rest   [] Pain in legs when laying flat   [] Claudication   [] Pain in feet when walking  [] Pain in feet at rest  [] Pain in feet when laying flat   [x] History of DVT   [] Phlebitis   [x] Swelling in legs   [] Varicose veins   [] Non-healing  ulcers Pulmonary:   [] Uses home oxygen   [] Productive cough   [] Hemoptysis   [] Wheeze  [] COPD   [] Asthma Neurologic:  [] Dizziness  [] Blackouts   [] Seizures   [] History of stroke   [] History of TIA  [] Aphasia   [] Temporary blindness   [] Dysphagia   [] Weakness or numbness in arms   [] Weakness or numbness in legs Musculoskeletal:  [] Arthritis   [] Joint swelling   [] Joint pain   [] Low back pain Hematologic:  [] Easy bruising  [] Easy bleeding   [x] Hypercoagulable state   [] Anemic  [] Hepatitis Gastrointestinal:  [] Blood in stool   [] Vomiting blood  [] Gastroesophageal reflux/heartburn   [] Difficulty swallowing. Genitourinary:   [] Chronic kidney disease   [] Difficult urination  [] Frequent urination  [] Burning with urination   [] Blood in urine Skin:  [] Rashes   [] Ulcers   [] Wounds Psychological:  [] History of anxiety   []  History of major depression.  Physical Examination  Vitals:   09/30/20 2116 09/30/20 2328 10/01/20 0411 10/01/20 0730  BP: 124/87 137/90 114/77 118/66  Pulse: 79 74 78 75  Resp: 16 16 16 16   Temp: 98.3 F (36.8 C) 98.6 F (37 C) 97.6 F (36.4 C) 97.9 F (36.6 C)  TempSrc: Oral Oral Oral   SpO2: 98% 100% 98% 99%  Weight:      Height:       Body mass index is 22.96 kg/m. Gen:  WD/WN, NAD Head: [place]/AT, No temporalis wasting. Prominent temp pulse not noted. Ear/Nose/Throat: Hearing grossly intact, nares w/o erythema or drainage, oropharynx w/o Erythema/Exudate Eyes: Sclera non-icteric, conjunctiva clear Neck: Trachea midline.  No JVD.  Pulmonary:  Good air movement, respirations not labored, equal bilaterally.  Cardiac: RRR, normal S1, S2. Vascular:  Vessel Right Left  Radial Palpable Palpable  Ulnar Palpable Palpable  Brachial Palpable Palpable  Carotid Palpable, without bruit Palpable, without bruit  Aorta Not palpable N/A  Femoral Palpable Palpable  Popliteal Palpable Palpable  PT Palpable Palpable  DP Palpable Palpable   Right Lower Extremity: Thigh soft.  Calf soft.  Extremities warm distally toes.  Minimal edema.  There is no acute vascular compromise noted to extremity at this time.  Gastrointestinal: soft, non-tender/non-distended. No guarding/reflex.  Musculoskeletal: M/S 5/5 throughout.  Extremities without ischemic changes.  No deformity or atrophy. No edema. Neurologic: Sensation grossly intact in extremities.  Symmetrical.  Speech is fluent. Motor exam as listed above. Psychiatric: Judgment intact, Mood & affect appropriate for pt's clinical situation. Dermatologic: No rashes or ulcers noted.  No cellulitis or open wounds. Lymph : No Cervical, Axillary, or Inguinal  lymphadenopathy.  CBC Lab Results  Component Value Date   WBC 7.3 09/30/2020   HGB 16.2 09/30/2020   HCT 47.9 09/30/2020   MCV 91.4 09/30/2020   PLT 290 09/30/2020   BMET    Component Value Date/Time   NA 137 09/30/2020 1642   K 4.3 09/30/2020 1642   CL 102 09/30/2020 1642   CO2 27 09/30/2020 1642   GLUCOSE 85 09/30/2020 1642   BUN 15 09/30/2020 1642   CREATININE 1.13 09/30/2020 1642   CALCIUM 9.0 09/30/2020 1642   GFRNONAA >60 09/30/2020 1642   GFRAA >60 07/31/2020 1955   Estimated Creatinine Clearance: 99 mL/min (by C-G formula based on SCr of 1.13 mg/dL).  COAG Lab Results  Component Value Date   INR 1.2 09/30/2020   INR 1.6 (H) 10/18/2019   INR 1.1 09/27/2019   Radiology Venous Img Lower Unilateral Right (DVT)  Result Date: 09/30/2020 CLINICAL DATA:  Leg  swelling for 1 week. EXAM: RIGHT LOWER EXTREMITY VENOUS DOPPLER ULTRASOUND TECHNIQUE: Gray-scale sonography with compression, as well as color and duplex ultrasound, were performed to evaluate the deep venous system(s) from the level of the common femoral vein through the popliteal and proximal calf veins. COMPARISON:  None. FINDINGS: VENOUS Stent noted within the right femoral vein. There is occlusive thrombus in the right common femoral vein and the right femoral vein. Nonocclusive thrombus within the popliteal vein. No evidence of DVT within the right posterior tibial vein and peroneal veins. Limited views of the contralateral common femoral vein are unremarkable. IMPRESSION: Deep vein thrombosis involving the right common femoral vein, femoral vein, and popliteal vein. Findings discussed with Dr. Scotty Court via telephone at 5:18 p.m. Electronically Signed   By: Feliberto Harts MD   On: 09/30/2020 17:21   Assessment/Plan Donalda Ewings Gluth is a 29 y.o. male with medical history significant for Protein C deficiency diagnosed in 2013 when he was found to have bilateral ophthalmic vein thrombosis, PE and right common  femoral vein thrombosis,  with stent placement in the RLE and with recurrent episodes of thrombosis since,  presenting with progressively worsening right lower extremity edema  similar to prior episodes of thrombosis in the leg.  Found to have proximal right lower extremity DVT.  1.  Right lower extremity DVT: Patient with known history of protein C deficiency.  Unfortunately, there is a history of noncompliance with both Eliquis and Coumadin.  Patient presents with progressively worsening right lower extremity discomfort and swelling.  Patient was found to have proximal DVT nonocclusive to the popliteal and transitioning to occlusive in the femoral vein.  Past medical history of possible stenting in the past.  Agree with the initiation of Eliquis (loading dose) then 5mg  twice daily dosing.  In young healthy active patient with symptoms limiting his ability to function on a daily basis with proximal DVT recommend undergoing a right lower extremity venous thrombectomy/thrombolysis.  Procedure, risks and benefits were explained to the patient.  All questions were answered.  Patient wishes to proceed.  2.  Protein C deficiency: Known history. Was on both Coumadin and Eliquis with noncompliance Recommend follow-up with hematologist at discharge.  3.  Noncompliance: With noncompliance in regard to anticoagulation.  Been on Eliquis and Coumadin in the past.  Unfortunately, the patient's noncompliance places him at higher risk for propagation of DVT and development of PE.  Explained the importance of compliance to avoid these especially PE which can be fatal.  Expresses understanding.  Discussed with Dr. , PA-C  10/01/2020 10:57 AM  This note was created with Dragon medical transcription system.  Any error is purely unintentional

## 2020-10-01 NOTE — Progress Notes (Signed)
  Chaplain On-Call provided Advance Directives documents and education for patient per Order Requisition from RN [employer].  Provided spiritual and emotional support as the patient described that he might be discharged later today.  Chaplain Vershawn Westrup B. Donika Butner M.Div., [facility]

## 2020-10-25 IMAGING — CR DG CHEST 2V
2 series · 2 of 2 positions shown · non-contrast
Comparison: None.

CLINICAL DATA: Right-sided chest pain over the past few days
getting worse. History of clotting disorder.

EXAM:
CHEST - 2 VIEW

[chest pa]
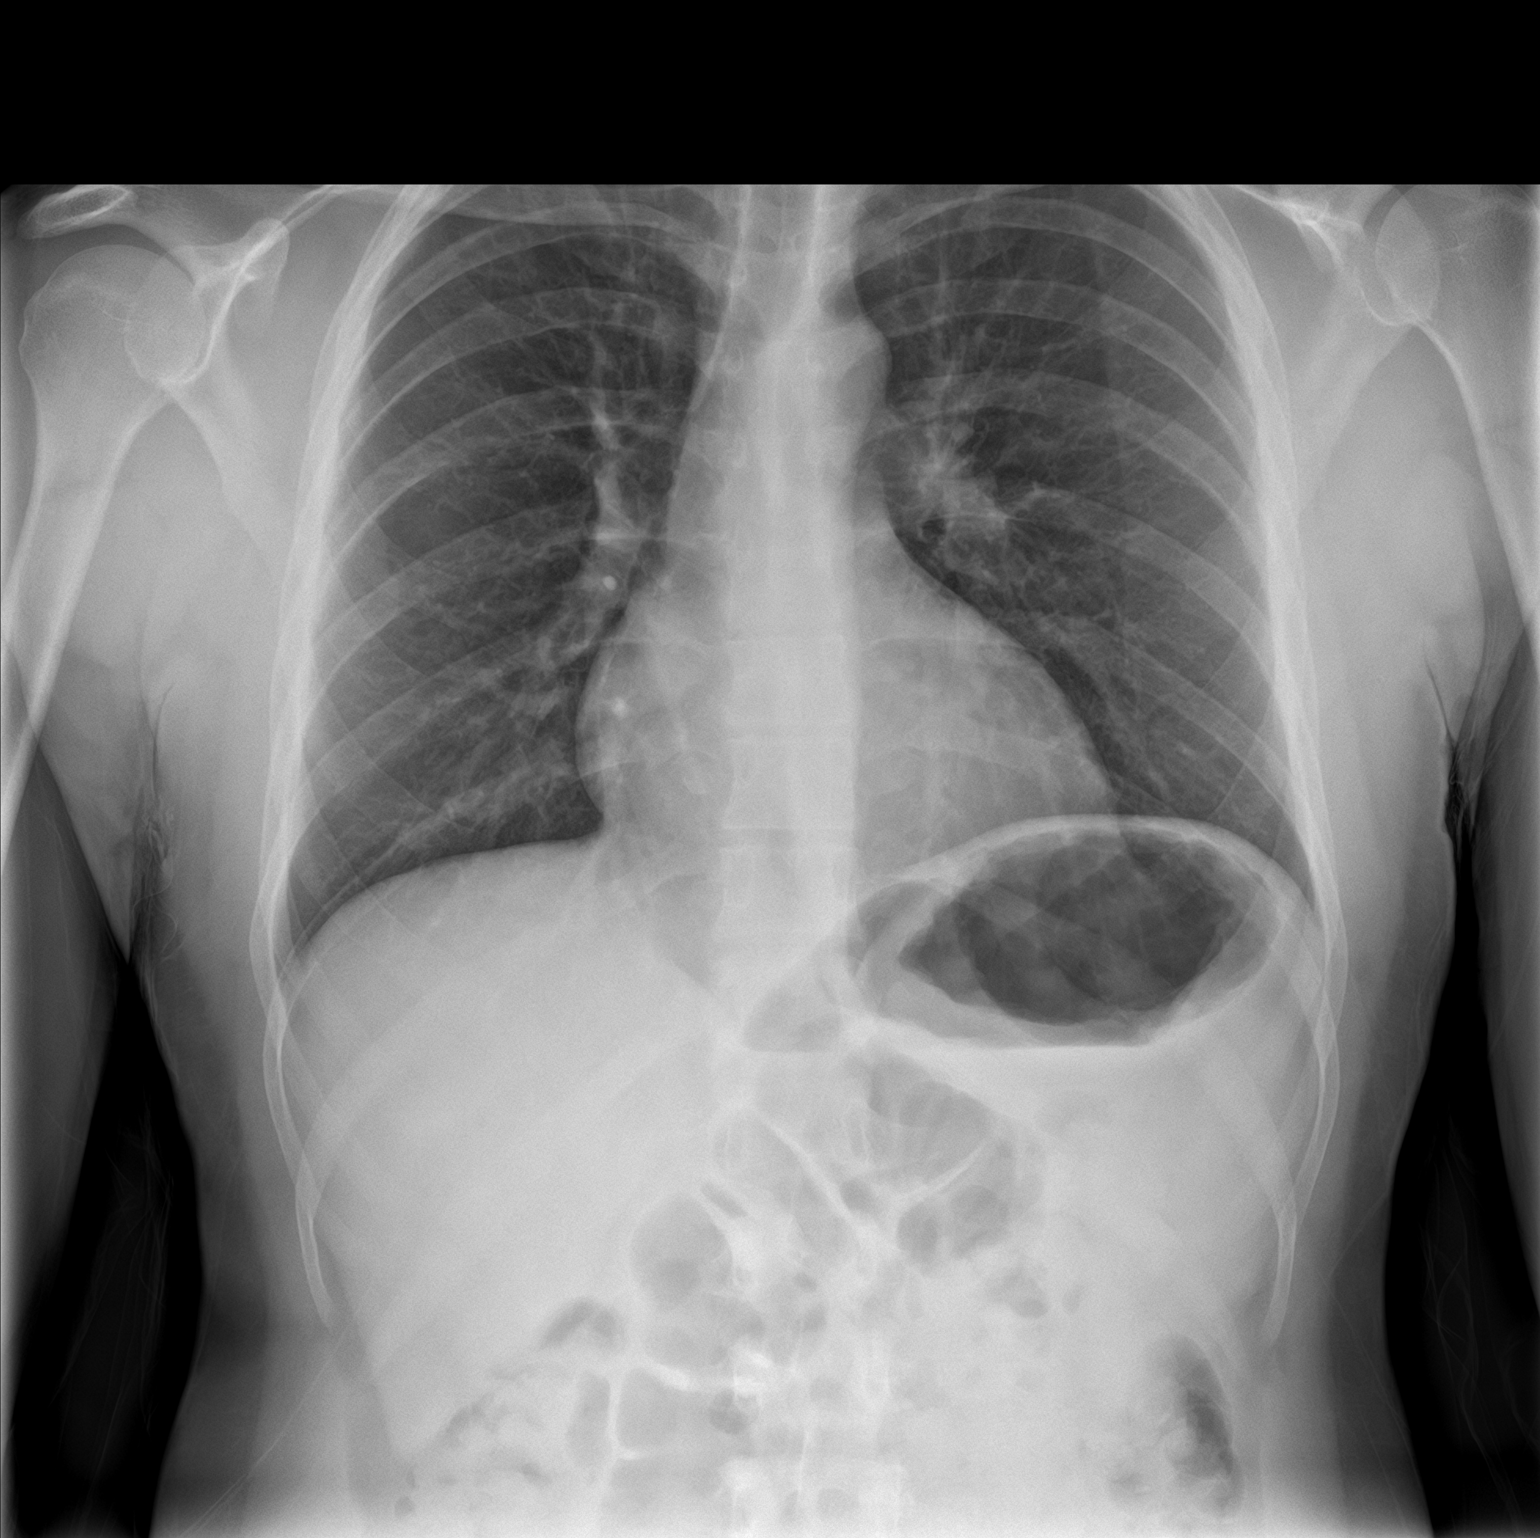

[chest lat]
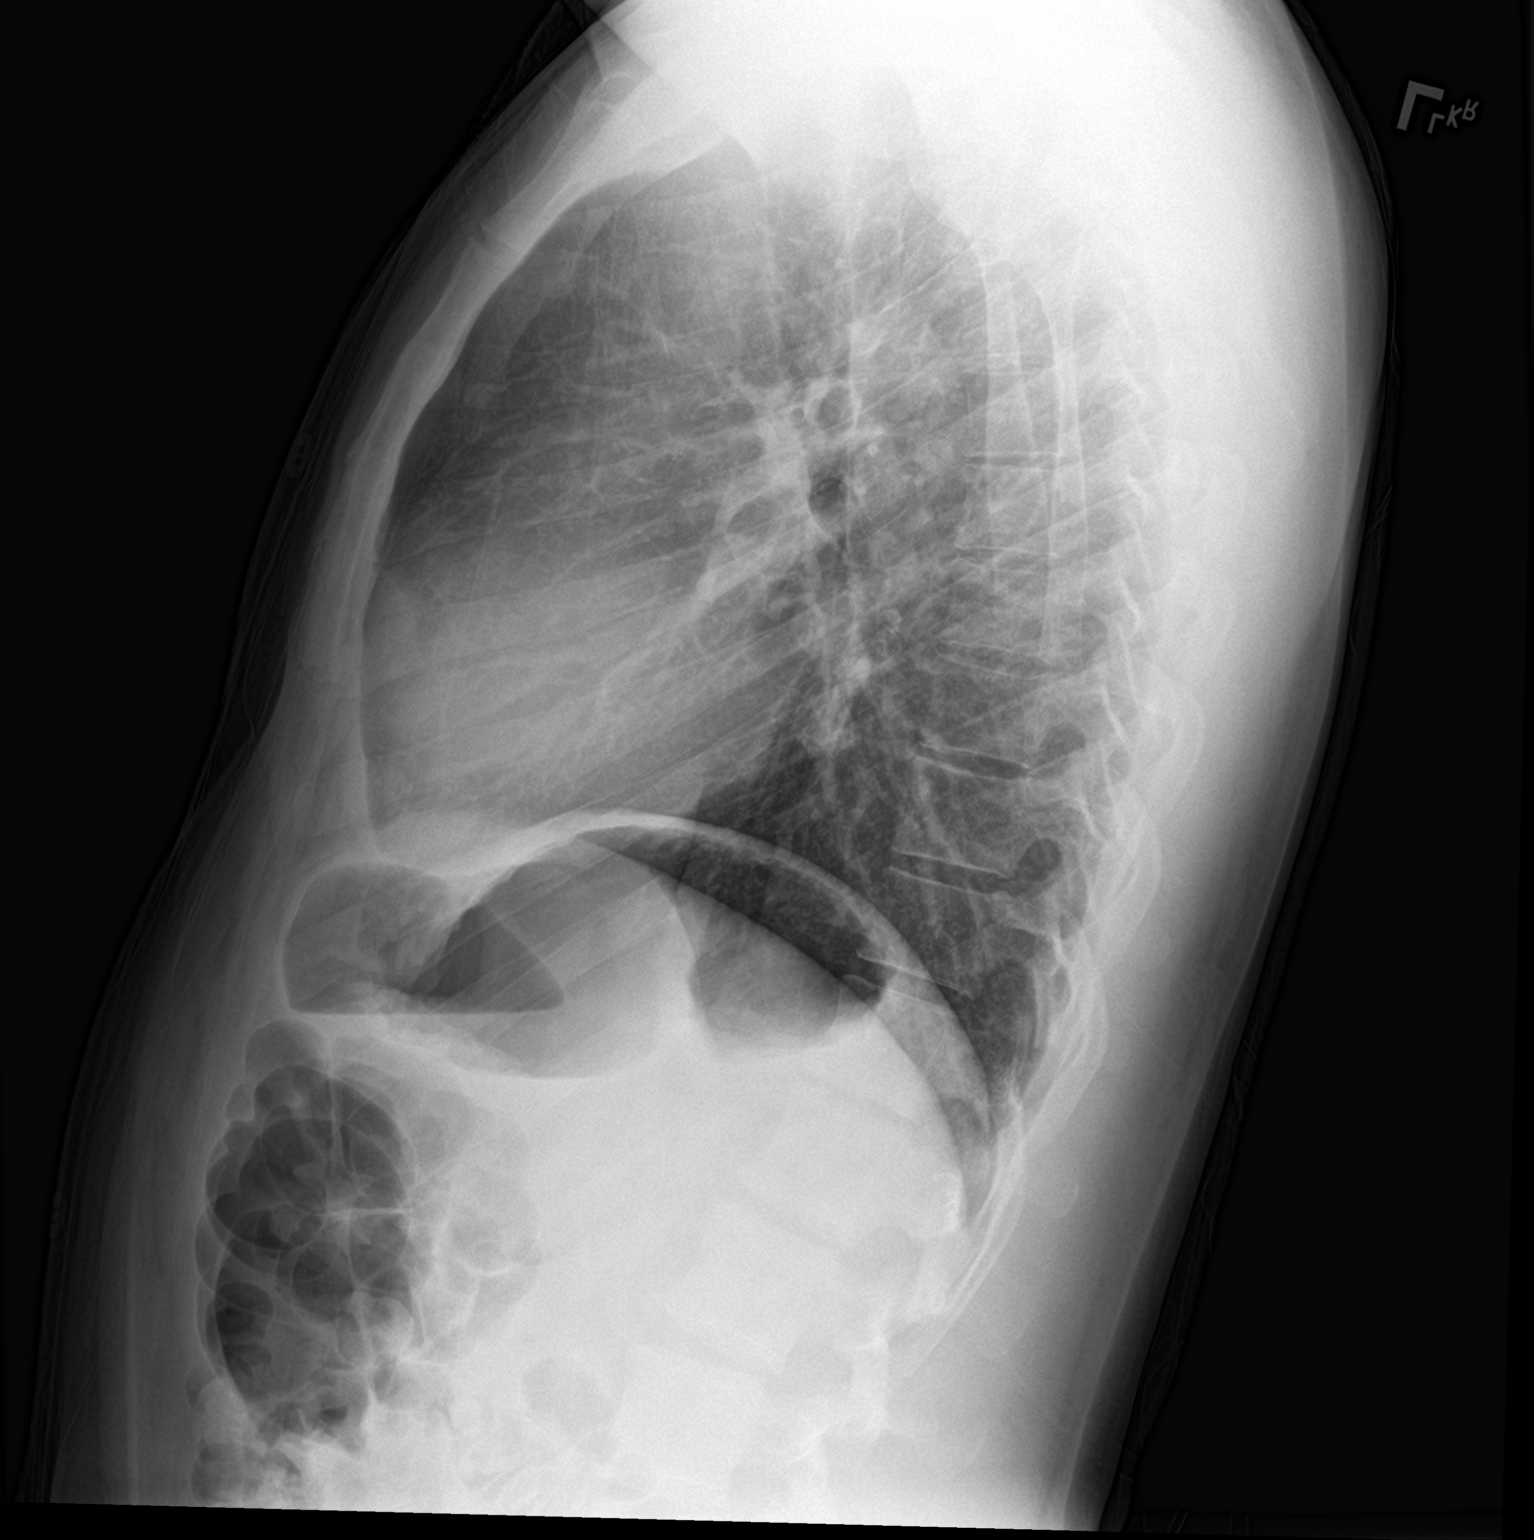

[2 of 2 positions shown; findings below may reference images not displayed]

FINDINGS: Lungs are clear. Cardiomediastinal silhouette is normal. Bones and
soft tissues are normal.
IMPRESSION: No active cardiopulmonary disease.

## 2020-11-15 IMAGING — US US EXTREM  UP VENOUS*L*
1 series · 13 of 24 positions shown · non-contrast
Comparison: None.

CLINICAL DATA: Left lower extremity pain and edema. History of DVT.
Patient is currently on anticoagulation. Evaluate for acute or
chronic DVT.



[Series 1: us extrem up venous*left* · 13 of 34 slices shown]
[im 1/34]
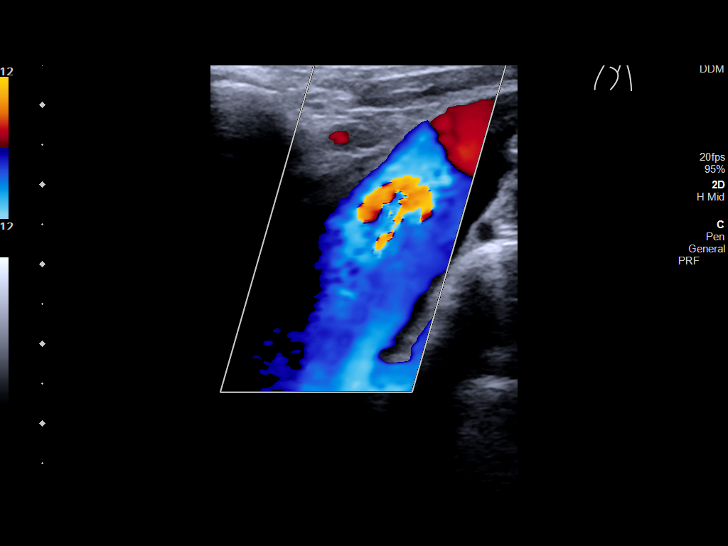
[im 3/34]
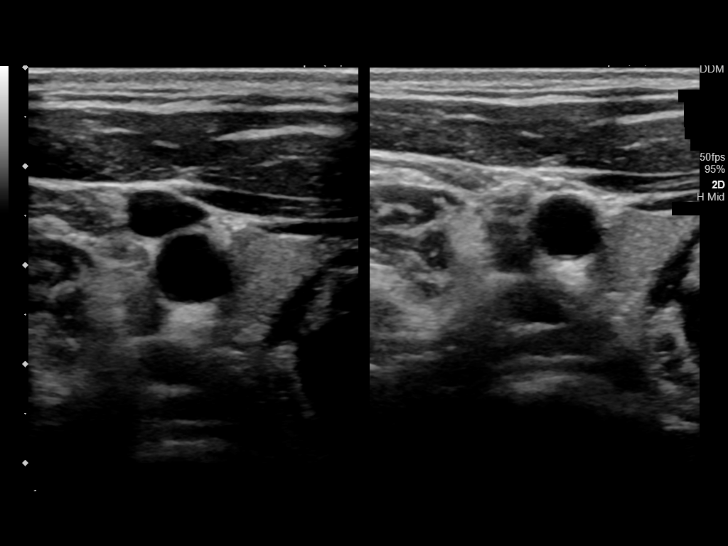
[im 6/34]
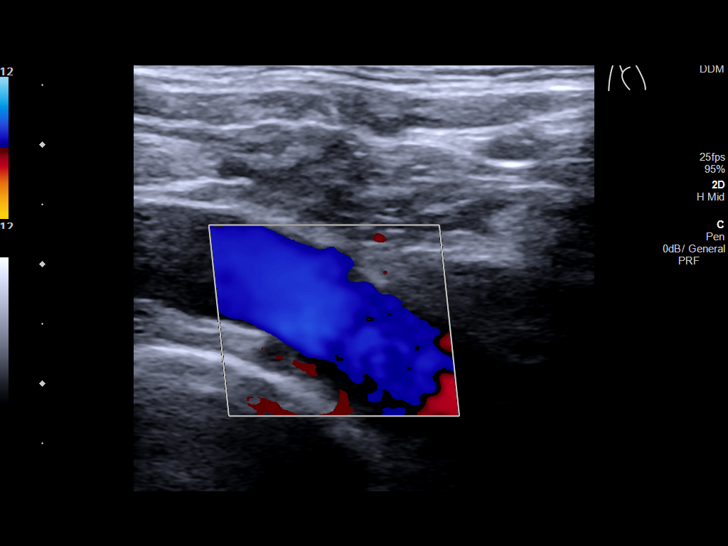
[im 9/34]
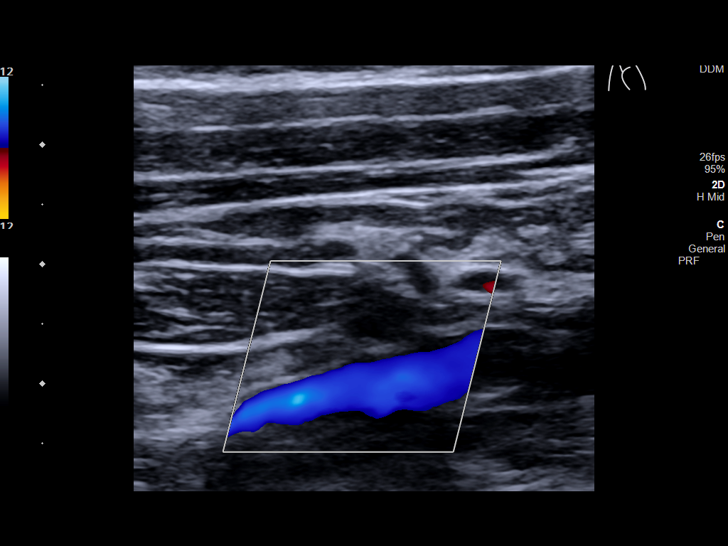
[im 12/34]
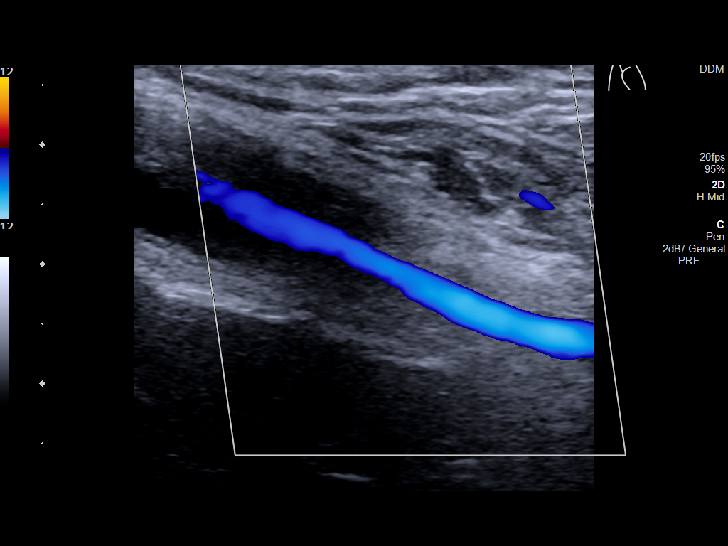
[im 15/34]
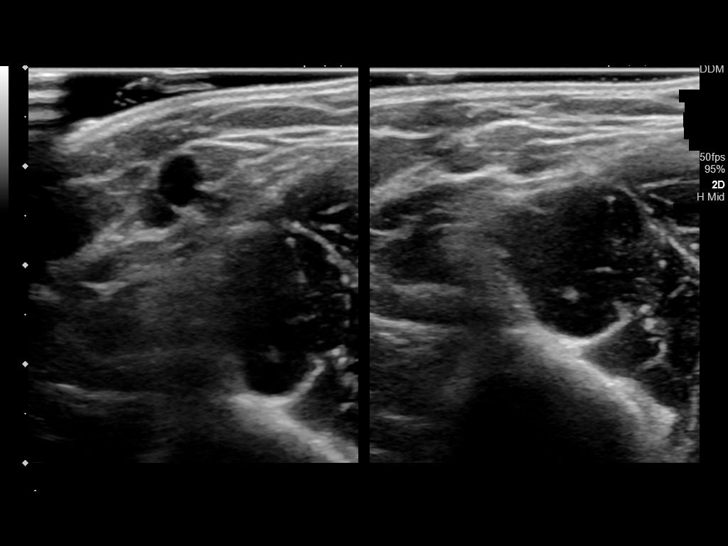
[im 18/34]
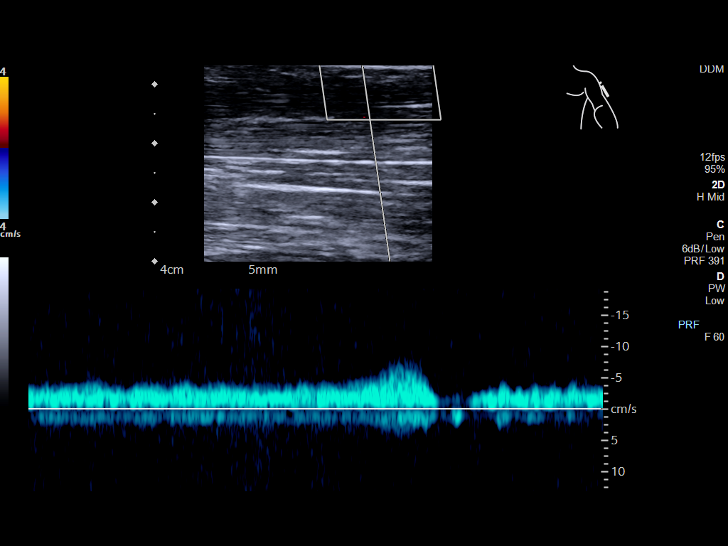
[im 19/34]
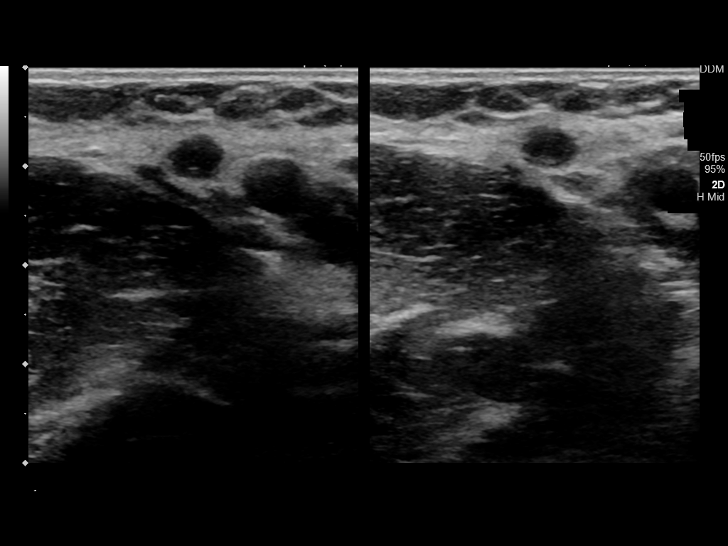
[im 22/34]
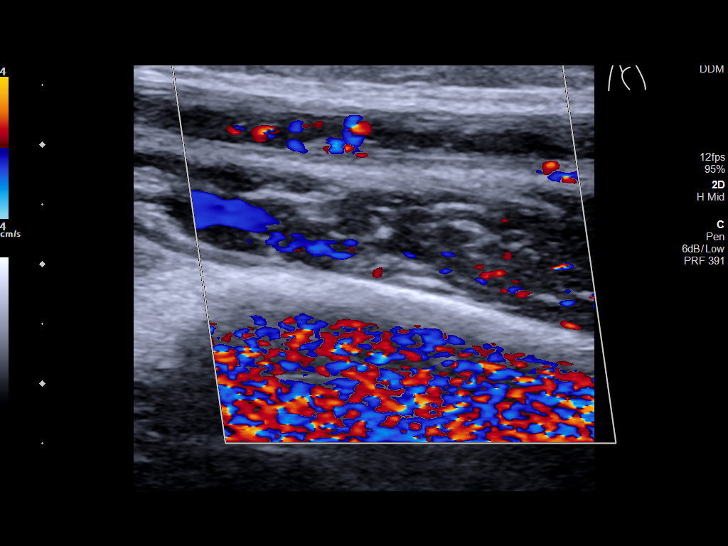
[im 25/34]
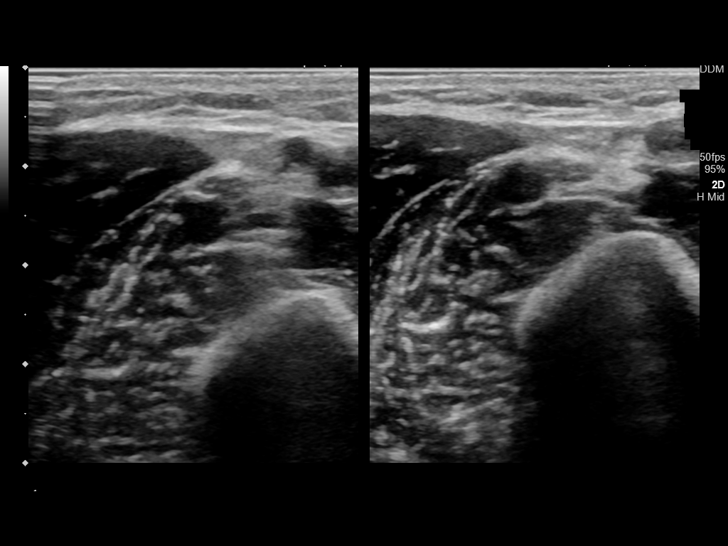
[im 28/34]
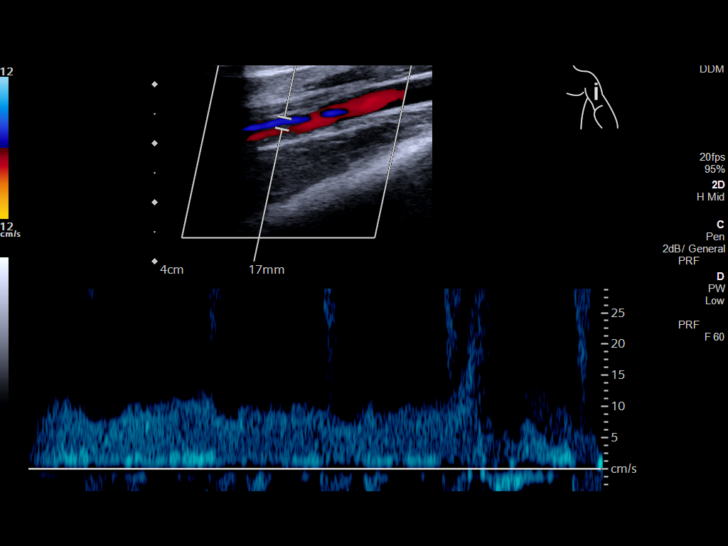
[im 31/34]
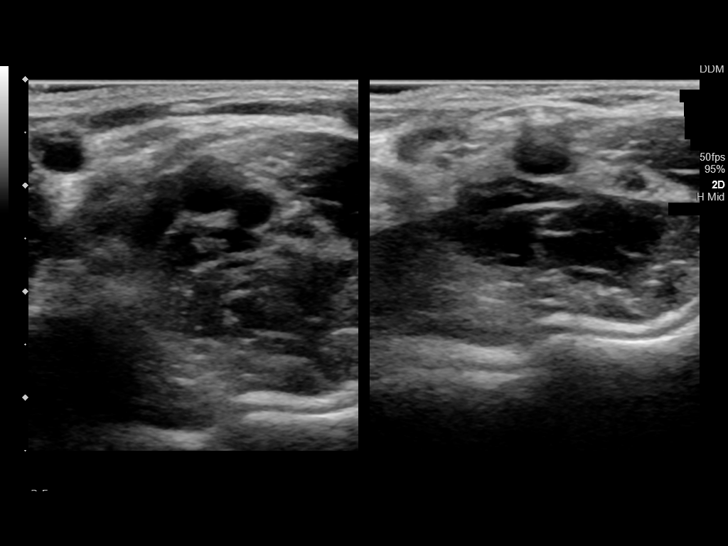
[im 34/34]
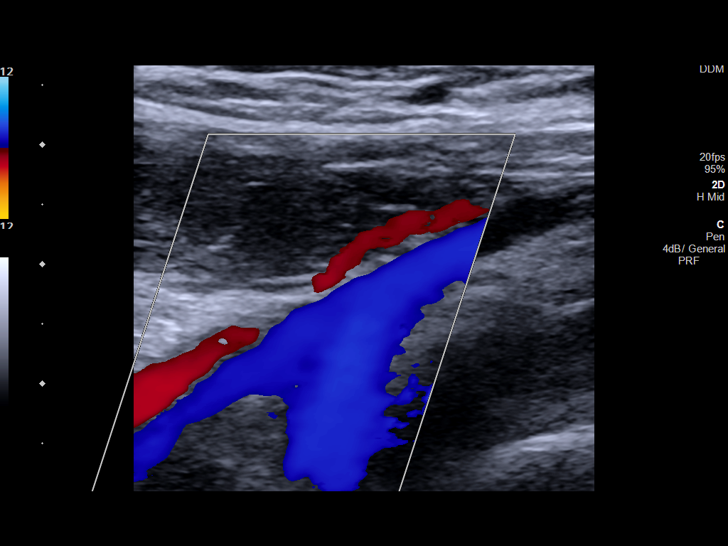

[13 of 24 positions shown; findings below may reference images not displayed]

FINDINGS: Contralateral Subclavian Vein: Respiratory phasicity is normal and
symmetric with the symptomatic side. No evidence of thrombus. Normal
compressibility.

Internal Jugular Vein: No evidence of thrombus. Normal
compressibility, respiratory phasicity and response to augmentation.

Subclavian Vein: No evidence of thrombus. Normal compressibility,
respiratory phasicity and response to augmentation.

Axillary Vein: No evidence of thrombus. Normal compressibility,
respiratory phasicity and response to augmentation.

Cephalic Vein: No evidence of thrombus. Normal compressibility,
respiratory phasicity and response to augmentation.

Basilic Vein: There is hypoechoic occlusive thrombus involving the
left basilic vein (images 20 through 24).

Brachial Veins: No evidence of thrombus. Normal compressibility,
respiratory phasicity and response to augmentation.

Radial Veins: No evidence of thrombus. Normal compressibility,
respiratory phasicity and response to augmentation.

Ulnar Veins: No evidence of thrombus. Normal compressibility,
respiratory phasicity and response to augmentation.

Venous Reflux:  None visualized.

Other Findings:  None visualized.
IMPRESSION: 1. No evidence of DVT within the left upper extremity.
2. Examination is positive for occlusive superficial
thrombophlebitis involving the left basilic vein.

## 2022-09-28 ENCOUNTER — Encounter (INDEPENDENT_AMBULATORY_CARE_PROVIDER_SITE_OTHER): Payer: Self-pay
